# Patient Record
Sex: Female | Born: 1960 | Race: Black or African American | Hispanic: No | Smoking: Never smoker
Health system: Southern US, Community
[De-identification: ages and names within clinical notes are randomized; demographics above are authoritative.]

## PROBLEM LIST (undated history)

## (undated) DIAGNOSIS — IMO0002 Reserved for concepts with insufficient information to code with codable children: Secondary | ICD-10-CM

## (undated) DIAGNOSIS — N83209 Unspecified ovarian cyst, unspecified side: Secondary | ICD-10-CM

## (undated) DIAGNOSIS — J45909 Unspecified asthma, uncomplicated: Secondary | ICD-10-CM

## (undated) DIAGNOSIS — IMO0001 Reserved for inherently not codable concepts without codable children: Secondary | ICD-10-CM

## (undated) DIAGNOSIS — R896 Abnormal cytological findings in specimens from other organs, systems and tissues: Secondary | ICD-10-CM

## (undated) DIAGNOSIS — R102 Pelvic and perineal pain: Secondary | ICD-10-CM

## (undated) DIAGNOSIS — R0789 Other chest pain: Secondary | ICD-10-CM

## (undated) DIAGNOSIS — R2231 Localized swelling, mass and lump, right upper limb: Secondary | ICD-10-CM

## (undated) DIAGNOSIS — Z8742 Personal history of other diseases of the female genital tract: Secondary | ICD-10-CM

## (undated) DIAGNOSIS — S46912A Strain of unspecified muscle, fascia and tendon at shoulder and upper arm level, left arm, initial encounter: Secondary | ICD-10-CM

## (undated) DIAGNOSIS — K219 Gastro-esophageal reflux disease without esophagitis: Secondary | ICD-10-CM

## (undated) DIAGNOSIS — R600 Localized edema: Secondary | ICD-10-CM

## (undated) DIAGNOSIS — L290 Pruritus ani: Secondary | ICD-10-CM

## (undated) DIAGNOSIS — B019 Varicella without complication: Secondary | ICD-10-CM

## (undated) DIAGNOSIS — Z8619 Personal history of other infectious and parasitic diseases: Secondary | ICD-10-CM

## (undated) DIAGNOSIS — Z87898 Personal history of other specified conditions: Secondary | ICD-10-CM

## (undated) DIAGNOSIS — N3941 Urge incontinence: Secondary | ICD-10-CM

## (undated) HISTORY — DX: Reserved for inherently not codable concepts without codable children: IMO0001

## (undated) HISTORY — DX: Unspecified ovarian cyst, unspecified side: N83.209

## (undated) HISTORY — DX: Urge incontinence: N39.41

## (undated) HISTORY — DX: Personal history of other infectious and parasitic diseases: Z86.19

## (undated) HISTORY — PX: ACNE CYST REMOVAL: SUR1112

## (undated) HISTORY — DX: Localized edema: R60.0

## (undated) HISTORY — DX: Unspecified asthma, uncomplicated: J45.909

## (undated) HISTORY — DX: Personal history of other diseases of the female genital tract: Z87.42

## (undated) HISTORY — DX: Reserved for concepts with insufficient information to code with codable children: IMO0002

## (undated) HISTORY — DX: Pruritus ani: L29.0

## (undated) HISTORY — DX: Gastro-esophageal reflux disease without esophagitis: K21.9

## (undated) HISTORY — DX: Pelvic and perineal pain: R10.2

## (undated) HISTORY — DX: Localized swelling, mass and lump, right upper limb: R22.31

## (undated) HISTORY — DX: Other chest pain: R07.89

## (undated) HISTORY — DX: Personal history of other specified conditions: Z87.898

## (undated) HISTORY — DX: Varicella without complication: B01.9

## (undated) HISTORY — DX: Strain of unspecified muscle, fascia and tendon at shoulder and upper arm level, left arm, initial encounter: S46.912A

## (undated) HISTORY — DX: Abnormal cytological findings in specimens from other organs, systems and tissues: R89.6

---

## 1989-06-18 DIAGNOSIS — R87619 Unspecified abnormal cytological findings in specimens from cervix uteri: Secondary | ICD-10-CM

## 1989-06-18 DIAGNOSIS — IMO0002 Reserved for concepts with insufficient information to code with codable children: Secondary | ICD-10-CM

## 1989-06-18 HISTORY — DX: Reserved for concepts with insufficient information to code with codable children: IMO0002

## 1989-06-18 HISTORY — DX: Unspecified abnormal cytological findings in specimens from cervix uteri: R87.619

## 1999-10-19 DIAGNOSIS — N3941 Urge incontinence: Secondary | ICD-10-CM

## 1999-10-19 HISTORY — DX: Urge incontinence: N39.41

## 1999-11-30 ENCOUNTER — Other Ambulatory Visit: Admission: RE | Admit: 1999-11-30 | Discharge: 1999-11-30 | Payer: Self-pay | Admitting: Obstetrics & Gynecology

## 2001-10-18 DIAGNOSIS — Z87898 Personal history of other specified conditions: Secondary | ICD-10-CM

## 2001-10-18 HISTORY — DX: Personal history of other specified conditions: Z87.898

## 2002-01-04 ENCOUNTER — Ambulatory Visit (HOSPITAL_COMMUNITY): Admission: RE | Admit: 2002-01-04 | Discharge: 2002-01-04 | Payer: Self-pay | Admitting: Obstetrics and Gynecology

## 2002-01-04 ENCOUNTER — Encounter: Payer: Self-pay | Admitting: Obstetrics and Gynecology

## 2002-01-15 ENCOUNTER — Other Ambulatory Visit: Admission: RE | Admit: 2002-01-15 | Discharge: 2002-01-15 | Payer: Self-pay | Admitting: Obstetrics and Gynecology

## 2002-01-16 DIAGNOSIS — N83209 Unspecified ovarian cyst, unspecified side: Secondary | ICD-10-CM

## 2002-01-16 HISTORY — DX: Unspecified ovarian cyst, unspecified side: N83.209

## 2002-01-22 ENCOUNTER — Ambulatory Visit (HOSPITAL_COMMUNITY): Admission: RE | Admit: 2002-01-22 | Discharge: 2002-01-22 | Payer: Self-pay | Admitting: Obstetrics and Gynecology

## 2002-01-22 ENCOUNTER — Encounter: Payer: Self-pay | Admitting: Obstetrics and Gynecology

## 2002-02-27 ENCOUNTER — Encounter: Payer: Self-pay | Admitting: Obstetrics and Gynecology

## 2002-02-27 ENCOUNTER — Ambulatory Visit (HOSPITAL_COMMUNITY): Admission: RE | Admit: 2002-02-27 | Discharge: 2002-02-27 | Payer: Self-pay | Admitting: Obstetrics and Gynecology

## 2002-04-03 ENCOUNTER — Encounter: Payer: Self-pay | Admitting: Obstetrics and Gynecology

## 2002-04-03 ENCOUNTER — Ambulatory Visit (HOSPITAL_COMMUNITY): Admission: RE | Admit: 2002-04-03 | Discharge: 2002-04-03 | Payer: Self-pay | Admitting: Obstetrics and Gynecology

## 2002-08-20 ENCOUNTER — Encounter: Admission: RE | Admit: 2002-08-20 | Discharge: 2002-08-20 | Payer: Self-pay | Admitting: Internal Medicine

## 2002-08-20 ENCOUNTER — Encounter: Payer: Self-pay | Admitting: Internal Medicine

## 2003-01-16 ENCOUNTER — Encounter: Payer: Self-pay | Admitting: Obstetrics and Gynecology

## 2003-01-16 ENCOUNTER — Ambulatory Visit (HOSPITAL_COMMUNITY): Admission: RE | Admit: 2003-01-16 | Discharge: 2003-01-16 | Payer: Self-pay | Admitting: Obstetrics and Gynecology

## 2003-02-12 ENCOUNTER — Other Ambulatory Visit: Admission: RE | Admit: 2003-02-12 | Discharge: 2003-02-12 | Payer: Self-pay | Admitting: Obstetrics and Gynecology

## 2004-01-21 ENCOUNTER — Ambulatory Visit (HOSPITAL_COMMUNITY): Admission: RE | Admit: 2004-01-21 | Discharge: 2004-01-21 | Payer: Self-pay | Admitting: Obstetrics and Gynecology

## 2004-02-24 ENCOUNTER — Other Ambulatory Visit: Admission: RE | Admit: 2004-02-24 | Discharge: 2004-02-24 | Payer: Self-pay | Admitting: Obstetrics and Gynecology

## 2004-10-18 DIAGNOSIS — IMO0001 Reserved for inherently not codable concepts without codable children: Secondary | ICD-10-CM

## 2004-10-18 DIAGNOSIS — R87610 Atypical squamous cells of undetermined significance on cytologic smear of cervix (ASC-US): Secondary | ICD-10-CM | POA: Insufficient documentation

## 2004-10-18 HISTORY — DX: Reserved for inherently not codable concepts without codable children: IMO0001

## 2005-01-22 ENCOUNTER — Ambulatory Visit (HOSPITAL_COMMUNITY): Admission: RE | Admit: 2005-01-22 | Discharge: 2005-01-22 | Payer: Self-pay | Admitting: Obstetrics and Gynecology

## 2005-02-26 ENCOUNTER — Other Ambulatory Visit: Admission: RE | Admit: 2005-02-26 | Discharge: 2005-02-26 | Payer: Self-pay | Admitting: Obstetrics and Gynecology

## 2005-10-28 ENCOUNTER — Other Ambulatory Visit: Admission: RE | Admit: 2005-10-28 | Discharge: 2005-10-28 | Payer: Self-pay | Admitting: Obstetrics and Gynecology

## 2005-12-16 DIAGNOSIS — R102 Pelvic and perineal pain: Secondary | ICD-10-CM

## 2005-12-16 HISTORY — DX: Pelvic and perineal pain: R10.2

## 2006-01-24 ENCOUNTER — Ambulatory Visit (HOSPITAL_COMMUNITY): Admission: RE | Admit: 2006-01-24 | Discharge: 2006-01-24 | Payer: Self-pay | Admitting: Obstetrics and Gynecology

## 2006-04-17 DIAGNOSIS — S46912A Strain of unspecified muscle, fascia and tendon at shoulder and upper arm level, left arm, initial encounter: Secondary | ICD-10-CM

## 2006-04-17 HISTORY — DX: Strain of unspecified muscle, fascia and tendon at shoulder and upper arm level, left arm, initial encounter: S46.912A

## 2006-04-18 ENCOUNTER — Other Ambulatory Visit: Admission: RE | Admit: 2006-04-18 | Discharge: 2006-04-18 | Payer: Self-pay | Admitting: Obstetrics and Gynecology

## 2007-01-26 ENCOUNTER — Ambulatory Visit (HOSPITAL_COMMUNITY): Admission: RE | Admit: 2007-01-26 | Discharge: 2007-01-26 | Payer: Self-pay | Admitting: Obstetrics and Gynecology

## 2007-03-08 ENCOUNTER — Encounter: Admission: RE | Admit: 2007-03-08 | Discharge: 2007-03-08 | Payer: Self-pay | Admitting: Family Medicine

## 2008-01-29 ENCOUNTER — Ambulatory Visit (HOSPITAL_COMMUNITY): Admission: RE | Admit: 2008-01-29 | Discharge: 2008-01-29 | Payer: Self-pay | Admitting: Obstetrics and Gynecology

## 2008-05-18 DIAGNOSIS — R2231 Localized swelling, mass and lump, right upper limb: Secondary | ICD-10-CM

## 2008-05-18 HISTORY — DX: Localized swelling, mass and lump, right upper limb: R22.31

## 2008-10-02 DIAGNOSIS — R2230 Localized swelling, mass and lump, unspecified upper limb: Secondary | ICD-10-CM | POA: Insufficient documentation

## 2008-10-09 ENCOUNTER — Encounter: Admission: RE | Admit: 2008-10-09 | Discharge: 2008-10-09 | Payer: Self-pay | Admitting: Obstetrics and Gynecology

## 2009-02-07 ENCOUNTER — Encounter: Admission: RE | Admit: 2009-02-07 | Discharge: 2009-02-07 | Payer: Self-pay | Admitting: Obstetrics and Gynecology

## 2009-05-18 DIAGNOSIS — L290 Pruritus ani: Secondary | ICD-10-CM

## 2009-05-18 HISTORY — DX: Pruritus ani: L29.0

## 2010-03-03 ENCOUNTER — Encounter: Admission: RE | Admit: 2010-03-03 | Discharge: 2010-03-03 | Payer: Self-pay | Admitting: Obstetrics and Gynecology

## 2010-11-08 ENCOUNTER — Encounter: Payer: Self-pay | Admitting: Obstetrics and Gynecology

## 2011-02-01 ENCOUNTER — Other Ambulatory Visit (HOSPITAL_COMMUNITY): Payer: Self-pay | Admitting: Obstetrics and Gynecology

## 2011-02-01 DIAGNOSIS — Z1231 Encounter for screening mammogram for malignant neoplasm of breast: Secondary | ICD-10-CM

## 2011-02-12 ENCOUNTER — Ambulatory Visit (INDEPENDENT_AMBULATORY_CARE_PROVIDER_SITE_OTHER): Payer: Federal, State, Local not specified - PPO | Admitting: Cardiovascular Disease

## 2011-02-12 ENCOUNTER — Encounter: Payer: Self-pay | Admitting: Cardiovascular Disease

## 2011-02-12 VITALS — BP 100/70 | HR 60 | Resp 12 | Ht 65.0 in | Wt 228.0 lb

## 2011-02-12 DIAGNOSIS — R079 Chest pain, unspecified: Secondary | ICD-10-CM | POA: Insufficient documentation

## 2011-02-12 DIAGNOSIS — R072 Precordial pain: Secondary | ICD-10-CM

## 2011-02-12 DIAGNOSIS — J45909 Unspecified asthma, uncomplicated: Secondary | ICD-10-CM | POA: Insufficient documentation

## 2011-02-12 NOTE — Patient Instructions (Signed)
Your physician has requested that you have a stress echocardiogram. For further information please visit [email or web address]. Please follow instruction sheet as given.   

## 2011-02-12 NOTE — Progress Notes (Signed)
50 yo referred by Dr [facility] [address].  Longstanding asthma.  2 weeks of atypical upper chest pain. 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  Asthma flair recently as well but pain not related to asthmatic tightness.  No cough sputum or fever.  No previous cardiac problem  Active at work and home.  Sedentary.  Nonsmoker.  No history of DVT or PE.  Pain is persistant but not progressive.  Upper chest radiates to right shoulder.  Not taking antiinflamatory meds. Denies palpiations, syncope, PND orthopnea or edema.    ROS: Denies fever, malais, weight loss, blurry vision, decreased visual acuity, cough, sputum, SOB, hemoptysis, pleuritic pain, palpitaitons, heartburn, abdominal pain, melena, lower extremity edema, claudication, or rash.   General: Affect appropriate Healthy:  appears stated age HEENT: normal Neck supple with no adenopathy JVP normal no bruits no thyromegaly Lungs clear with no wheezing and good diaphragmatic motion Heart:  S1/S2 no murmur,rub, gallop or click PMI normal Abdomen: benighn, BS positve, no tenderness, no AAA no bruit.  No HSM or HJR Distal pulses intact with no bruits No edema Neuro non-focal Skin warm and dry No muscular weakness  Medications Current Outpatient Prescriptions  Medication Sig Dispense Refill  . FOLIC ACID PO 1 tab po qd         Allergies Review of patient's allergies indicates no known allergies.  Family History: Family History  Problem Relation Age of Onset  . Diabetes    . Emphysema Sister   . Stroke Maternal Grandmother     Social History: History   Social History  . Marital Status: Single    Spouse Name: N/A    Number of Children: N/A  . Years of Education: N/A   Occupational History  . Not on file.   Social History Main Topics  . Smoking status: Never Smoker   . Smokeless tobacco: Never Used  . Alcohol Use: No  . Drug Use: No  . Sexually Active: Not on file    Other Topics Concern  . Not on file   Social History Narrative  . No narrative on file    Electrocardiogram:  NSR normal ECG rate 60  Assessment and Plan

## 2011-02-12 NOTE — Assessment & Plan Note (Signed)
Atypical likely muscular.  Normal exam and ECG  F/U stress echo

## 2011-02-12 NOTE — Assessment & Plan Note (Signed)
Continue albuterol PRN.  Prednisone taper per primary if wheezing persists

## 2011-02-16 DIAGNOSIS — Z8742 Personal history of other diseases of the female genital tract: Secondary | ICD-10-CM

## 2011-02-16 HISTORY — DX: Personal history of other diseases of the female genital tract: Z87.42

## 2011-02-19 ENCOUNTER — Ambulatory Visit (HOSPITAL_COMMUNITY): Payer: Federal, State, Local not specified - PPO | Attending: Cardiovascular Disease | Admitting: Radiology

## 2011-02-19 DIAGNOSIS — R072 Precordial pain: Secondary | ICD-10-CM

## 2011-02-19 DIAGNOSIS — R0789 Other chest pain: Secondary | ICD-10-CM | POA: Insufficient documentation

## 2011-03-08 ENCOUNTER — Telehealth: Payer: Self-pay | Admitting: *Deleted

## 2011-03-08 NOTE — Telephone Encounter (Signed)
PT AWARE NORMAL STRESS ECHO./CY

## 2011-03-11 ENCOUNTER — Ambulatory Visit (HOSPITAL_COMMUNITY)
Admission: RE | Admit: 2011-03-11 | Discharge: 2011-03-11 | Disposition: A | Discharge status: Home / Self Care | Payer: Federal, State, Local not specified - PPO | Source: Ambulatory Visit | Attending: Obstetrics and Gynecology | Admitting: Obstetrics and Gynecology

## 2011-03-11 DIAGNOSIS — Z1231 Encounter for screening mammogram for malignant neoplasm of breast: Secondary | ICD-10-CM

## 2011-05-16 ENCOUNTER — Emergency Department (HOSPITAL_COMMUNITY): Payer: Federal, State, Local not specified - PPO

## 2011-05-16 ENCOUNTER — Emergency Department (HOSPITAL_COMMUNITY)
Admission: EM | Admit: 2011-05-16 | Discharge: 2011-05-17 | Disposition: A | Discharge status: Home / Self Care | Payer: Federal, State, Local not specified - PPO | Attending: Emergency Medicine | Admitting: Emergency Medicine

## 2011-05-16 DIAGNOSIS — R079 Chest pain, unspecified: Secondary | ICD-10-CM | POA: Insufficient documentation

## 2011-05-16 DIAGNOSIS — M940 Chondrocostal junction syndrome [Tietze]: Secondary | ICD-10-CM | POA: Insufficient documentation

## 2011-05-16 DIAGNOSIS — R55 Syncope and collapse: Secondary | ICD-10-CM | POA: Insufficient documentation

## 2011-05-16 DIAGNOSIS — M7989 Other specified soft tissue disorders: Secondary | ICD-10-CM | POA: Insufficient documentation

## 2011-05-16 DIAGNOSIS — R062 Wheezing: Secondary | ICD-10-CM | POA: Insufficient documentation

## 2011-05-16 LAB — CBC
HCT: 38 % (ref 36.0–46.0)
Hemoglobin: 12.8 g/dL (ref 12.0–15.0)
MCV: 85.6 fL (ref 78.0–100.0)
Platelets: 210 10*3/uL (ref 150–400)
RDW: 13.6 % (ref 11.5–15.5)
WBC: 4.5 10*3/uL (ref 4.0–10.5)

## 2011-05-16 LAB — TROPONIN I: Troponin I: <0.3 ng/mL (ref <0.30)

## 2011-05-16 LAB — DIFFERENTIAL
Eosinophils Relative: 11 % — ABNORMAL HIGH (ref 0–5)
Monocytes Relative: 8 % (ref 3–12)
Neutrophils Relative %: 23 % — ABNORMAL LOW (ref 43–77)

## 2011-05-16 LAB — CK TOTAL AND CKMB (NOT AT ARMC)
CK, MB: 3 ng/mL (ref 0.3–4.0)
Total CK: 135 U/L (ref 7–177)

## 2011-05-16 LAB — BASIC METABOLIC PANEL
Calcium: 9.1 mg/dL (ref 8.4–10.5)
Creatinine, Ser: 0.92 mg/dL (ref 0.50–1.10)
GFR calc Af Amer: >60 mL/min (ref >60)

## 2011-10-01 ENCOUNTER — Ambulatory Visit (INDEPENDENT_AMBULATORY_CARE_PROVIDER_SITE_OTHER): Payer: Federal, State, Local not specified - PPO | Admitting: Cardiovascular Disease

## 2011-10-01 ENCOUNTER — Encounter: Payer: Self-pay | Admitting: Cardiovascular Disease

## 2011-10-01 DIAGNOSIS — J45909 Unspecified asthma, uncomplicated: Secondary | ICD-10-CM

## 2011-10-01 DIAGNOSIS — R079 Chest pain, unspecified: Secondary | ICD-10-CM

## 2011-10-01 NOTE — Progress Notes (Signed)
50 yo referred by [facility] [address]. Longstanding asthma. 2 weeks of atypical upper chest pain. 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 Asthma flair recently as well but pain not related to asthmatic tightness. No cough sputum or fever. No previous cardiac problem Active at work and home. Sedentary. Nonsmoker. No history of DVT or PE. Pain is persistant but not progressive. Upper chest radiates to right shoulder. Not taking antiinflamatory meds.  Denies palpiations, syncope, PND orthopnea or edema.   Seen in urgent care yesterday for atypical pain.  ECG reviewed and normal.  Pain has muscular overtones and is helped by NSAI  Also some relief with belching. Reviewed her stress echo from 5/12 which was normal.    ROS: Denies fever, malais, weight loss, blurry vision, decreased visual acuity, cough, sputum, SOB, hemoptysis, pleuritic pain, palpitaitons, heartburn, abdominal pain, melena, lower extremity edema, claudication, or rash.  All other systems reviewed and negative  General: Affect appropriate Healthy:  appears stated age HEENT: normal Neck supple with no adenopathy JVP normal no bruits no thyromegaly Lungs clear with midl inspitory wheezing and good diaphragmatic motion Heart:  S1/S2 no murmur,rub, gallop or click PMI normal Abdomen: benighn, BS positve, no tenderness, no AAA no bruit.  No HSM or HJR Distal pulses intact with no bruits No edema Neuro non-focal Skin warm and dry No muscular weakness   Current Outpatient Prescriptions  Medication Sig Dispense Refill  . albuterol (PROVENTIL) (2.5 MG/3ML) 0.083% nebulizer solution Take 2.5 mg by nebulization every 6 (six) hours as needed.        . Vitamin D, Ergocalciferol, (DRISDOL) 50000 UNITS CAPS Take 1 tablet by mouth Once a week.        Allergies  Review of patient's allergies indicates no known allergies.  Electrocardiogram:  Assessment and Plan

## 2011-10-01 NOTE — Assessment & Plan Note (Signed)
Atypical pain Normal stress echo  And normal ECG.  No need for further w/u

## 2011-10-01 NOTE — Assessment & Plan Note (Signed)
Got flu shot yesterday.  Mild wheezing Continue inhaler

## 2011-12-31 ENCOUNTER — Encounter: Payer: Self-pay | Admitting: Family Medicine

## 2011-12-31 ENCOUNTER — Ambulatory Visit (INDEPENDENT_AMBULATORY_CARE_PROVIDER_SITE_OTHER): Payer: Federal, State, Local not specified - PPO | Admitting: Family Medicine

## 2011-12-31 DIAGNOSIS — M791 Myalgia, unspecified site: Secondary | ICD-10-CM

## 2011-12-31 DIAGNOSIS — E559 Vitamin D deficiency, unspecified: Secondary | ICD-10-CM

## 2011-12-31 DIAGNOSIS — IMO0001 Reserved for inherently not codable concepts without codable children: Secondary | ICD-10-CM

## 2011-12-31 DIAGNOSIS — E669 Obesity, unspecified: Secondary | ICD-10-CM

## 2011-12-31 DIAGNOSIS — L299 Pruritus, unspecified: Secondary | ICD-10-CM

## 2011-12-31 MED ORDER — CETIRIZINE HCL 10 MG PO TABS: 10.0000 mg | ORAL_TABLET | Freq: Every day | ORAL | Status: DC

## 2011-12-31 NOTE — Progress Notes (Signed)
  Subjective:    Patient ID: Taylor Reynolds, female    DOB: May 14, 1961, 51 y.o.   MRN: [id number]  HPI This 51 y.o AA has a history of Vitamin D deficiency diagnosed last May at [facility]. She took  Drisdol for 3 months but then switched to Citracal. She has no hx of Diabetes, Lipids disorder, Thyroid  disorder or Anemia. She needs Vit D level rechecked.        Also has a itchy rash on right hand with small bumps for 24 hours. She has no food allergies and has  dog which is located outside. 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 She has knee pain and left shoulder pain with burning in the left anterior chest.     Review of Systems As per HPI; otherwise, noncontributory     Objective:   Physical Exam  Vitals reviewed. Constitutional: She is oriented to person, place, and time. She appears well-developed and well-nourished. No distress.       Moderately obese  HENT:  Head: Normocephalic and atraumatic.  Right Ear: External ear normal.  Left Ear: External ear normal.  Nose: Nose normal.  Mouth/Throat: Oropharynx is clear and moist.  Eyes: Conjunctivae and EOM are normal. No scleral icterus.  Neck: Normal range of motion. Neck supple. No thyromegaly present.  Cardiovascular: Normal rate, regular rhythm and normal heart sounds.  Exam reveals no gallop and no friction rub.   No murmur heard. Pulmonary/Chest: Effort normal and breath sounds normal. No respiratory distress.       Left anterior chest: tenderness with moderate palpation of pectoralis major muscle  Abdominal: Soft. Bowel sounds are normal. She exhibits no distension and no mass. There is no tenderness. There is no guarding.  Musculoskeletal: Normal range of motion. She exhibits no edema.  Lymphadenopathy:    She has no cervical adenopathy.  Neurological: She is alert and oriented to person, place, and time. She has normal reflexes. No cranial nerve deficit.    Skin: Skin is warm and dry.       Right hand: dry scaly area on dorsum of hand between thumb and 1st digit  Psychiatric: She has a normal mood and affect. Her behavior is normal. Judgment and thought content normal.          Assessment & Plan:   1. Vitamin d deficiency  Vitamin D, 25-hydroxy level p ending RX: Drisdol 50,000 IU  1 capsule once a week pending   results  2. Itching  Rash on hand probably work-related as pt is right-hand dominate; wear gloves at work and use moisturizer for dry skin RX: Cetirizine 10 mg  1 tab daily for itching  (pt has a lot of dust exposure at work)  3. Obesity  Counseled re: weight reduction  4. Myalgia  Comprehensive metabolic panel

## 2011-12-31 NOTE — Patient Instructions (Signed)
Vitamin D Deficiency  Not having enough vitamin D is called a deficiency. Your body needs this vitamin to keep your bones strong and healthy. Having too little of it can make your bones soft or can cause other health problems.  HOME CARE  Take all vitamins, herbs, or nutrition drinks (supplements) as told by your doctor.   Have your blood tested 2 months after taking vitamins, herbs, or nutrition drinks.   Eat foods that have vitamin D. This includes:   Dairy products, cereals, or juices with added vitamin D. Check the label.   Fatty fish like salmon or trout.   Eggs.   Oysters.   Go outside for 10 to 15 minutes when the sun is shining. Do this 3 times a week. Do not do this if you have skin cancer.   Do not use tanning beds.   Stay at a healthy weight. Lose weight if needed.   Keep all doctor visits as told.  GET HELP IF:  You have questions.   You continue to have problems.   You feel sick to your stomach (nauseous) or throw up (vomit).   You cannot go poop (constipated).   You feel confused.   You have severe belly (abdominal) or back pain.  MAKE SURE YOU:  Understand these instructions.   Will watch your condition.   Will get help right away if you are not doing well or get worse.  Document Released: 09/23/2011 Document Reviewed: 09/21/2011 Exit[employer] Patient Information 2012 [employer]. 

## 2012-01-01 LAB — CBC WITH DIFFERENTIAL/PLATELET
Basophils Relative: 1 % (ref 0–1)
Eosinophils Absolute: 0.6 10*3/uL (ref 0.0–0.7)
Hemoglobin: 12.4 g/dL (ref 12.0–15.0)
MCH: 28 pg (ref 26.0–34.0)
MCHC: 32.4 g/dL (ref 30.0–36.0)
Monocytes Absolute: 0.4 10*3/uL (ref 0.1–1.0)
Monocytes Relative: 9 % (ref 3–12)
Neutrophils Relative %: 27 % — ABNORMAL LOW (ref 43–77)

## 2012-01-01 LAB — COMPREHENSIVE METABOLIC PANEL
AST: 16 U/L (ref 0–37)
Alkaline Phosphatase: 49 U/L (ref 39–117)
CO2: 25 mEq/L (ref 19–32)
Glucose, Bld: 109 mg/dL — ABNORMAL HIGH (ref 70–99)
Sodium: 137 mEq/L (ref 135–145)
Total Protein: 6.5 g/dL (ref 6.0–8.3)

## 2012-01-01 LAB — VITAMIN D 25 HYDROXY (VIT D DEFICIENCY, FRACTURES): Vit D, 25-Hydroxy: 23 ng/mL — ABNORMAL LOW (ref 30–89)

## 2012-01-07 DIAGNOSIS — E559 Vitamin D deficiency, unspecified: Secondary | ICD-10-CM | POA: Insufficient documentation

## 2012-01-07 DIAGNOSIS — E669 Obesity, unspecified: Secondary | ICD-10-CM | POA: Insufficient documentation

## 2012-01-07 NOTE — Progress Notes (Signed)
Quick Note:  Please call pt and advise that the following labs are abnormal... Vitamin D level is low. Continue the medication prescribed.  Other labs are within normal limits.  Provide a copy of labs to pt. ______

## 2012-01-18 ENCOUNTER — Other Ambulatory Visit: Payer: Self-pay | Admitting: Obstetrics and Gynecology

## 2012-01-18 DIAGNOSIS — Z1231 Encounter for screening mammogram for malignant neoplasm of breast: Secondary | ICD-10-CM

## 2012-02-15 ENCOUNTER — Ambulatory Visit: Payer: Self-pay | Admitting: Obstetrics and Gynecology

## 2012-03-06 ENCOUNTER — Ambulatory Visit (INDEPENDENT_AMBULATORY_CARE_PROVIDER_SITE_OTHER): Payer: Federal, State, Local not specified - PPO | Admitting: Obstetrics and Gynecology

## 2012-03-06 ENCOUNTER — Encounter: Payer: Self-pay | Admitting: Obstetrics and Gynecology

## 2012-03-06 VITALS — BP 118/68 | Ht 65.5 in | Wt 236.0 lb

## 2012-03-06 DIAGNOSIS — Z124 Encounter for screening for malignant neoplasm of cervix: Secondary | ICD-10-CM

## 2012-03-06 DIAGNOSIS — Z1231 Encounter for screening mammogram for malignant neoplasm of breast: Secondary | ICD-10-CM

## 2012-03-06 NOTE — Progress Notes (Signed)
Last Pap:02/08/2011 WNL: No atypical squamous cells Regular Periods:yes Contraception: None  Monthly Breast exam:yes Tetanus<3yrs:yes Nl.Bladder Function:yes Daily BMs:yes Healthy Diet:yes Calcium:yes Mammogram:yes Exercise:no Seatbelt: yes Abuse at home: no Stressful work:no Sigmoid-colonoscopy: no Bone Density: No Pt without complaints Physical Examination: General appearance - alert, well appearing, and in no distress Mental status - normal mood, behavior, speech, dress, motor activity, and thought processes Neck - supple, no significant adenopathy, thyroid exam: thyroid is normal in size without nodules or tenderness Chest - clear to auscultation, no wheezes, rales or rhonchi, symmetric air entry Heart - normal rate and regular rhythm Abdomen - soft, nontender, nondistended, no masses or organomegaly Breasts - breasts appear normal, no suspicious masses, no skin or nipple changes or axillary nodes Pelvic - normal external genitalia, vulva, vagina, cervix, uterus and adnexa Rectal - normal rectal, no masses Back exam - full range of motion, no tenderness, palpable spasm or pain on motion Neurological - alert, oriented, normal speech, no focal findings or movement disorder noted Musculoskeletal - no joint tenderness, deformity or swelling Extremities - no edema, redness or tenderness in the calves or thighs Skin - normal coloration and turgor, no rashes, no suspicious skin lesions noted Routine exam Pap sent yes Mammogram due yes none RT one year

## 2012-03-06 NOTE — Patient Instructions (Signed)
Colonoscopy A colonoscopy is an exam to evaluate your entire colon. In this exam, your colon is cleansed. A long fiberoptic tube is inserted through your rectum and into your colon. The fiberoptic scope (endoscope) is a long bundle of enclosed and very flexible fibers. These fibers transmit light to the area examined and send images from that area to your caregiver. Discomfort is usually minimal. You may be given a drug to help you sleep (sedative) during or prior to the procedure. This exam helps to detect lumps (tumors), polyps, inflammation, and areas of bleeding. Your caregiver may also take a small piece of tissue (biopsy) that will be examined under a microscope. LET YOUR CAREGIVER KNOW ABOUT:   Allergies to food or medicine.   Medicines taken, including vitamins, herbs, eyedrops, over-the-counter medicines, and creams.   Use of steroids (by mouth or creams).   Previous problems with anesthetics or numbing medicines.   History of bleeding problems or blood clots.   Previous surgery.   Other health problems, including diabetes and kidney problems.   Possibility of pregnancy, if this applies.  BEFORE THE PROCEDURE   A clear liquid diet may be required for 2 days before the exam.   Ask your caregiver about changing or stopping your regular medications.   Liquid injections (enemas) or laxatives may be required.   A large amount of electrolyte solution may be given to you to drink over a short period of time. This solution is used to clean out your colon.   You should be present 60 minutes prior to your procedure or as directed by your caregiver.  AFTER THE PROCEDURE   If you received a sedative or pain relieving medication, you will need to arrange for someone to drive you home.   Occasionally, there is a little blood passed with the first bowel movement. Do not be concerned.  FINDING OUT THE RESULTS OF YOUR TEST Not all test results are available during your visit. If your test  results are not back during the visit, make an appointment with your caregiver to find out the results. Do not assume everything is normal if you have not heard from your caregiver or the medical facility. It is important for you to follow up on all of your test results. HOME CARE INSTRUCTIONS   It is not unusual to pass moderate amounts of gas and experience mild abdominal cramping following the procedure. This is due to air being used to inflate your colon during the exam. Walking or a warm pack on your belly (abdomen) may help.   You may resume all normal meals and activities after sedatives and medicines have worn off.   Only take over-the-counter or prescription medicines for pain, discomfort, or fever as directed by your caregiver. Do not use aspirin or blood thinners if a biopsy was taken. Consult your caregiver for medicine usage if biopsies were taken.  SEEK IMMEDIATE MEDICAL CARE IF:   You have a fever.   You pass large blood clots or fill a toilet with blood following the procedure. This may also occur 10 to 14 days following the procedure. This is more likely if a biopsy was taken.   You develop abdominal pain that keeps getting worse and cannot be relieved with medicine.  Document Released: 10/01/2000 Document Revised: 09/23/2011 Document Reviewed: 05/16/2008 Exit[employer] Patient Information 2012 [employer]. 

## 2012-03-09 LAB — PAP IG W/ RFLX HPV ASCU

## 2012-03-14 ENCOUNTER — Ambulatory Visit (HOSPITAL_COMMUNITY)
Admission: RE | Admit: 2012-03-14 | Discharge: 2012-03-14 | Disposition: A | Discharge status: Home / Self Care | Payer: Federal, State, Local not specified - PPO | Source: Ambulatory Visit | Attending: Obstetrics and Gynecology | Admitting: Obstetrics and Gynecology

## 2012-03-14 DIAGNOSIS — Z1231 Encounter for screening mammogram for malignant neoplasm of breast: Secondary | ICD-10-CM | POA: Insufficient documentation

## 2012-03-27 ENCOUNTER — Encounter: Payer: Self-pay | Admitting: Obstetrics and Gynecology

## 2012-03-30 ENCOUNTER — Telehealth: Payer: Self-pay | Admitting: Obstetrics and Gynecology

## 2012-03-30 NOTE — Telephone Encounter (Signed)
Niccole/ND pt °

## 2012-03-31 NOTE — Telephone Encounter (Signed)
Spoke with pt rgd msg pt received dense breast letter wants to know if she needs MRI advised pt wil consult with ND and call her back pt voice understanding

## 2012-04-05 ENCOUNTER — Encounter: Payer: Self-pay | Admitting: Family Medicine

## 2012-04-05 ENCOUNTER — Ambulatory Visit (INDEPENDENT_AMBULATORY_CARE_PROVIDER_SITE_OTHER): Payer: Federal, State, Local not specified - PPO | Admitting: Family Medicine

## 2012-04-05 VITALS — BP 124/75 | HR 60 | Temp 97.3°F | Resp 16 | Ht 65.0 in | Wt 234.0 lb

## 2012-04-05 DIAGNOSIS — M542 Cervicalgia: Secondary | ICD-10-CM

## 2012-04-05 DIAGNOSIS — E559 Vitamin D deficiency, unspecified: Secondary | ICD-10-CM

## 2012-04-05 DIAGNOSIS — R202 Paresthesia of skin: Secondary | ICD-10-CM

## 2012-04-05 DIAGNOSIS — R209 Unspecified disturbances of skin sensation: Secondary | ICD-10-CM

## 2012-04-05 DIAGNOSIS — R51 Headache: Secondary | ICD-10-CM

## 2012-04-05 LAB — BASIC METABOLIC PANEL
Chloride: 106 mEq/L (ref 96–112)
Potassium: 4.6 mEq/L (ref 3.5–5.3)

## 2012-04-05 NOTE — Progress Notes (Signed)
  Subjective:    Patient ID: Taylor Reynolds, female    DOB: Sep 11, 1961, 51 y.o.   MRN: [id number]  HPI  This 57 AA female c/o 3-week hx of generalized headaches, relieved with rest. No associated  n/v or visual disturbances . She had eye exam several months ago and will be getting corrective lenses  this month. 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 This causes tension and pain in back of neck as well as shoulder pain and paresthesias  in both arms. Because she works 3rd shift, she has poor nutrition habits, skipping meals and not  maintaining adequate hydration.  Not sleeping 6-8 hours at a time (broken sleep).      Review of Systems  Constitutional: Positive for appetite change and fatigue. Negative for fever, chills and unexpected weight change.  HENT: Positive for neck pain, neck stiffness and voice change. Negative for congestion, sore throat, rhinorrhea, sneezing, trouble swallowing and sinus pressure.   Eyes: Positive for visual disturbance. Negative for photophobia and pain.  Respiratory: Negative for chest tightness and shortness of breath.   Cardiovascular: Negative for chest pain and palpitations.  Musculoskeletal: Positive for back pain.  Neurological: Positive for weakness and headaches. Negative for dizziness, syncope and light-headedness.  Psychiatric/Behavioral: Positive for disturbed wake/sleep cycle.       Objective:   Physical Exam  Nursing note and vitals reviewed. Constitutional: She is oriented to person, place, and time. She appears well-developed and well-nourished. No distress.       Muscle spasms in trapezius   HENT:  Right Ear: External ear normal.  Left Ear: External ear normal.  Mouth/Throat: Oropharynx is clear and moist.  Eyes: Conjunctivae and EOM are normal. No scleral icterus.  Neck: Neck supple. No thyromegaly present.  Cardiovascular: Normal rate, regular rhythm and normal heart  sounds.   No murmur heard. Pulmonary/Chest: Effort normal and breath sounds normal. No respiratory distress.  Musculoskeletal: Normal range of motion. She exhibits no edema.  Lymphadenopathy:    She has no cervical adenopathy.  Neurological: She is alert and oriented to person, place, and time. No cranial nerve deficit.  Skin: Skin is warm and dry.  Psychiatric: She has a normal mood and affect. Her behavior is normal.          Assessment & Plan:   1. Headache - suspect tension -type associated with poor health habits Basic metabolic panel (glucose elevated on CMET in March 2013)  2. Vitamin d deficiency - taking 50,000 IU weekly Vitamin D, 25-hydroxy  3. Neck pain associated with work-related strain Advised relaxation exercises for better neck health Aleve 1-2 tabs bid with meals; moist heat  4. Paresthesias  Consider C-spine films if persists

## 2012-04-05 NOTE — Patient Instructions (Addendum)
Headache, General, Unknown Cause The specific cause of your headache may not have been found today. There are many causes and types of headache. A few common ones are:  Tension headache.   Migraine.   Infections (examples: dental and sinus infections).   Bone and/or joint problems in the neck or jaw.   Depression.   Eye problems.  These headaches are not life threatening.  Headaches can sometimes be diagnosed by a patient history and a physical exam. Sometimes, lab and imaging studies (such as x-ray and/or CT scan) are used to rule out more serious problems. In some cases, a spinal tap (lumbar puncture) may be requested. There are many times when your exam and tests may be normal on the first visit even when there is a serious problem causing your headaches. Because of that, it is very important to follow up with your doctor or local clinic for further evaluation. FINDING OUT THE RESULTS OF TESTS  If a radiology test was performed, a radiologist will review your results.   You will be contacted by the emergency department or your physician if any test results require a change in your treatment plan.   Not all test results may be available during your visit. If your test results are not back during the visit, make an appointment with your caregiver to find out the results. Do not assume everything is normal if you have not heard from your caregiver or the medical facility. It is important for you to follow up on all of your test results.  HOME CARE INSTRUCTIONS   Keep follow-up appointments with your caregiver, or any specialist referral.   Only take over-the-counter or prescription medicines for pain, discomfort, or fever as directed by your caregiver.   Biofeedback, massage, or other relaxation techniques may be helpful.   Ice packs or heat applied to the head and neck can be used. Do this three to four times per day, or as needed.   Call your doctor if you have any questions or  concerns.   If you smoke, you should quit.  SEEK MEDICAL CARE IF:   You develop problems with medications prescribed.   You do not respond to or obtain relief from medications.   You have a change from the usual headache.   You develop nausea or vomiting.  SEEK IMMEDIATE MEDICAL CARE IF:   If your headache becomes severe.   You have an unexplained oral temperature above 102 F (38.9 C), or as your caregiver suggests.   You have a stiff neck.   You have loss of vision.   You have muscular weakness.   You have loss of muscular control.   You develop severe symptoms different from your first symptoms.   You start losing your balance or have trouble walking.   You feel faint or pass out.  MAKE SURE YOU:   Understand these instructions.   Will watch your condition.   Will get help right away if you are not doing well or get worse.  Document Released: 10/04/2005 Document Revised: 09/23/2011 Document Reviewed: 05/23/2008 [facility] Patient Information 2012 [place], [place]     .Degenerative Disc Disease Degenerative disc disease is a condition caused by the changes that occur in the cushions of the backbone (spinal discs) as you grow older. Spinal discs are soft and compressible discs located between the bones of the spine (vertebrae). They act like shock absorbers. Degenerative disc disease can affect the wholespine. However, the neck and lower back are  most commonly affected. Many changes can occur in the spinal discs with aging, such as:  The spinal discs may dry and shrink.   Small tears may occur in the tough, outer covering of the disc (annulus).   The disc space may become smaller due to loss of water.   Abnormal growths in the bone (spurs) may occur. This can put pressure on the nerve roots exiting the spinal canal, causing pain.   The spinal canal may become narrowed.  CAUSES  Degenerative disc disease is a condition caused by the changes that occur in the  spinal discs with aging. The exact cause is not known, but there is a genetic basis for many patients. Degenerative changes can occur due to loss of fluid in the disc. This makes the disc thinner and reduces the space between the backbones. Small cracks can develop in the outer layer of the disc. This can lead to the breakdown of the disc. You are more likely to get degenerative disc disease if you are overweight. Smoking cigarettes and doing heavy work such as weightlifting can also increase your risk of this condition. Degenerative changes can start after a sudden injury. Growth of bone spurs can compress the nerve roots and cause pain.  SYMPTOMS  The symptoms vary from person to person. Some people may have no pain, while others have severe pain. The pain may be so severe that it can limit your activities. The location of the pain depends on the part of your backbone that is affected. You will have neck or arm pain if a disc in the neck area is affected. You will have pain in your back, buttocks, or legs if a disc in the lower back is affected. The pain becomes worse while bending, reaching up, or with twisting movements. The pain may start gradually and then get worse as time passes. It may also start after a major or minor injury. You may feel numbness or tingling in the arms or legs.  DIAGNOSIS  Your caregiver will ask you about your symptoms and about activities or habits that may cause the pain. He or she may also ask about any injuries, diseases, ortreatments you have had earlier. Your caregiver will examine you to check for the range of movement that is possible in the affected area, to check for strength in your extremities, and to check for sensation in the areas of the arms and legs supplied by different nerve roots. An X-ray of the spine may be taken. Your caregiver may suggest other imaging tests, such as a computerized magnetic scan (MRI), if needed.  TREATMENT  Treatment includes rest,  modifying your activities, and applying ice and heat. Your caregiver may prescribe medicines to reduce your pain and may ask you to do some exercises to strengthen your back. In some cases, you may need surgery. You and your caregiver will decide on the treatment that is best for you. HOME CARE INSTRUCTIONS   Follow proper lifting and walking techniques as advised by your caregiver.   Maintain good posture.   Exercise regularly as advised.   Perform relaxation exercises.   Change your sitting, standing, and sleeping habits as advised. Change positions frequently.   Lose weight as advised.   Stop smoking if you smoke.   Wear supportive footwear.  SEEK MEDICAL CARE IF:  The pain does not go away within 1 to 4 weeks. SEEK IMMEDIATE MEDICAL CARE IF:   The pain is severe.   You notice weakness  in your arms, hands, or legs.   You begin to lose control of your bladder or bowel.  MAKE SURE YOU:   Understand these instructions.   Will watch your condition.   Will get help right away if you are not doing well or get worse.  Document Released: 08/01/2007 Document Revised: 09/23/2011 Document Reviewed: 08/01/2007 [facility] Patient Information 2012 [place], [place].    HEADACHES:  Try to eat regular meals and have healthy snacks- do not go more than 4 hours without eating. Drink plenty of fluids, especially water.                            You need 6-8 hours of sleep most nights. Take Aleve for headache- 1-2 tabs twice a day with meals. Moist heat across the back of the neck may help.

## 2012-04-06 LAB — VITAMIN D 25 HYDROXY (VIT D DEFICIENCY, FRACTURES): Vit D, 25-Hydroxy: 17 ng/mL — ABNORMAL LOW (ref 30–89)

## 2012-04-06 NOTE — Telephone Encounter (Signed)
If she has no family history of a first degree relative with breast cancer she does not need any further testing.  She should continue monthly breast exams

## 2012-04-08 ENCOUNTER — Other Ambulatory Visit: Payer: Self-pay | Admitting: Family Medicine

## 2012-04-08 MED ORDER — VITAMIN D (ERGOCALCIFEROL) 1.25 MG (50000 UNIT) PO CAPS: ORAL_CAPSULE | ORAL | Status: DC

## 2012-04-08 NOTE — Progress Notes (Signed)
Quick Note:  Please call pt and advise that the following labs are abnormal...  Vitamin D level is still low. Continue Vit D 50,000 IU supplement 1 capsule every week for next several months; Contact office if she needs refills. Needs to get some sun exposure most days for 10-15 minutes. Other labs are normal.   Copy to pt. ______

## 2012-05-04 ENCOUNTER — Telehealth: Payer: Self-pay | Admitting: Obstetrics and Gynecology

## 2012-05-04 NOTE — Telephone Encounter (Signed)
Lm on vm tcb rgd msg 

## 2012-05-04 NOTE — Telephone Encounter (Signed)
TRIAGE/EPIC °

## 2012-05-05 NOTE — Telephone Encounter (Signed)
Lm on vm tcb rgd msg 

## 2012-05-08 NOTE — Telephone Encounter (Signed)
Spoke with pt rgd msg pt states having prolong cycle 2 weeks wants appt for eval pt has appt 05/19/12 at 11:15 with ND pt voice understanding

## 2012-05-19 ENCOUNTER — Encounter: Payer: Self-pay | Admitting: Obstetrics and Gynecology

## 2012-05-19 ENCOUNTER — Ambulatory Visit (INDEPENDENT_AMBULATORY_CARE_PROVIDER_SITE_OTHER): Payer: Federal, State, Local not specified - PPO | Admitting: Obstetrics and Gynecology

## 2012-05-19 VITALS — BP 110/72 | Wt 233.0 lb

## 2012-05-19 DIAGNOSIS — N926 Irregular menstruation, unspecified: Secondary | ICD-10-CM

## 2012-05-19 LAB — CBC WITH DIFFERENTIAL/PLATELET
Basophils Absolute: 0.1 10*3/uL (ref 0.0–0.1)
Eosinophils Absolute: 0.3 10*3/uL (ref 0.0–0.7)
Lymphs Abs: 2.1 10*3/uL (ref 0.7–4.0)
MCH: 28.5 pg (ref 26.0–34.0)
Neutrophils Relative %: 38 % — ABNORMAL LOW (ref 43–77)
Platelets: 302 10*3/uL (ref 150–400)
RBC: 4.46 MIL/uL (ref 3.87–5.11)
RDW: 14.4 % (ref 11.5–15.5)
WBC: 4.8 10*3/uL (ref 4.0–10.5)

## 2012-05-19 NOTE — Progress Notes (Signed)
When did bleeding start: 04/22/12 How  Long: still bleeding How often changing pad/tampon: once per day Bleeding Disorders: no Cramping: no Contraception: no Fibroids: no Hormone Therapy: no New Medications: no Menopausal Symptoms: no Vag. Discharge: no Abdominal Pain: no Increased Stress: no BP 110/72  Wt 233 lb (105.688 kg)  LMP 04/22/2012 Physical Examination: Neck - supple, no significant adenopathy, thyroid exam: thyroid is normal in size without nodules or tenderness Chest - clear to auscultation, no wheezes, rales or rhonchi, symmetric air entry Heart - normal rate and regular rhythm Abdomen - soft, nontender, nondistended, no masses or organomegaly Pelvic - normal external genitalia, vulva, vagina, cervix, uterus and adnexa Irregular Bleeding Pt stopped.  SHG/EMBX WNL 02/2011 US/EMBX @[facility]  TSH/CBC today

## 2012-05-22 NOTE — Telephone Encounter (Signed)
Did you speak with this pt again

## 2012-05-23 NOTE — Telephone Encounter (Signed)
Pt had appt 05/19/12 and has f/u appt 06/16/12 with you

## 2012-06-16 ENCOUNTER — Encounter: Payer: Federal, State, Local not specified - PPO | Admitting: Obstetrics and Gynecology

## 2012-06-16 ENCOUNTER — Ambulatory Visit (INDEPENDENT_AMBULATORY_CARE_PROVIDER_SITE_OTHER): Payer: Federal, State, Local not specified - PPO | Admitting: Obstetrics and Gynecology

## 2012-06-16 ENCOUNTER — Ambulatory Visit (INDEPENDENT_AMBULATORY_CARE_PROVIDER_SITE_OTHER): Payer: Federal, State, Local not specified - PPO

## 2012-06-16 ENCOUNTER — Encounter: Payer: Self-pay | Admitting: Obstetrics and Gynecology

## 2012-06-16 ENCOUNTER — Other Ambulatory Visit: Payer: Self-pay | Admitting: Obstetrics and Gynecology

## 2012-06-16 VITALS — BP 112/80 | Wt 234.0 lb

## 2012-06-16 DIAGNOSIS — D219 Benign neoplasm of connective and other soft tissue, unspecified: Secondary | ICD-10-CM

## 2012-06-16 DIAGNOSIS — N926 Irregular menstruation, unspecified: Secondary | ICD-10-CM

## 2012-06-16 DIAGNOSIS — N83209 Unspecified ovarian cyst, unspecified side: Secondary | ICD-10-CM

## 2012-06-16 DIAGNOSIS — D259 Leiomyoma of uterus, unspecified: Secondary | ICD-10-CM

## 2012-06-16 LAB — POCT URINE PREGNANCY: Preg Test, Ur: NEGATIVE

## 2012-06-16 MED ORDER — CEPHALEXIN 500 MG PO CAPS: 500.0000 mg | ORAL_CAPSULE | Freq: Four times a day (QID) | ORAL | Status: AC

## 2012-06-16 NOTE — Patient Instructions (Signed)
Fibroids You have been diagnosed as having a fibroid. Fibroids are smooth muscle lumps (tumors) which can occur any place in a woman's body. They are usually in the womb (uterus). The most common problem (symptom) of fibroids is bleeding. Over time this may cause low red blood cells (anemia). Other symptoms include feelings of pressure and pain in the pelvis. The diagnosis (learning what is wrong) of fibroids is made by physical exam. Sometimes tests such as an ultrasound are used. This is helpful when fibroids are felt around the ovaries and to look for tumors. TREATMENT   Most fibroids do not need surgical or medical treatment. Sometimes a tissue sample (biopsy) of the lining of the uterus is done to rule out cancer. If there is no cancer and only a small amount of bleeding, the problem can be watched.   Hormonal treatment can improve the problem.   When surgery is needed, it can consist of removing the fibroid. Vaginal birth may not be possible after the removal of fibroids. This depends on where they are and the extent of surgery. When pregnancy occurs with fibroids it is usually normal.   Your caregiver can help decide which treatments are best for you.  HOME CARE INSTRUCTIONS   Do not use aspirin as this may increase bleeding problems.   If your periods (menses) are heavy, record the number of pads or tampons used per month. Bring this information to your caregiver. This can help them determine the best treatment for you.  SEEK IMMEDIATE MEDICAL CARE IF:  You have pelvic pain or cramps not controlled with medications, or experience a sudden increase in pain.   You have an increase of pelvic bleeding between and during menses.   You feel lightheaded or have fainting spells.   You develop worsening belly (abdominal) pain.  Document Released: 10/01/2000 Document Revised: 09/23/2011 Document Reviewed: 05/23/2008 [employer] Patient Information 2012 [employer].    

## 2012-06-16 NOTE — Progress Notes (Signed)
Pt is here for f/u u/s.  Pt given 600 mg ibuprofen per protocol Claudie Leach M UPT negative Pt states she has not had any more bleeding US uterus 10.18cm by 8.16 cm with three fibroids.  The largest is 4cm Pt also with right simple ovarian cyst that is wcm in sixe EMBX done per protocol did sound to 9 cm  Pt desires observation for her fibroids pts right upper extremity is red and hot to touch.  She states she was bitten.  Will give keflex.  Pt to go to ER if it becomes worse F/u with an [place] in 6-8 weeks to f/u the ovarian cyst

## 2012-06-22 ENCOUNTER — Telehealth: Payer: Self-pay

## 2012-06-22 NOTE — Telephone Encounter (Signed)
Message copied by Rolla Plate on Thu Jun 22, 2012  9:59 AM ------      Message from: Jaymes Graff      Created: Thu Jun 22, 2012  9:48 AM       Please call the patient and let her know her endometrial biopsy is normal

## 2012-06-22 NOTE — Telephone Encounter (Signed)
Spoke with pt rgd labs informed endo biopsy wnl pt voice understanding 

## 2012-07-03 ENCOUNTER — Emergency Department (HOSPITAL_COMMUNITY): Payer: Federal, State, Local not specified - PPO

## 2012-07-03 ENCOUNTER — Encounter (HOSPITAL_COMMUNITY): Payer: Self-pay | Admitting: *Deleted

## 2012-07-03 ENCOUNTER — Emergency Department (HOSPITAL_COMMUNITY)
Admission: EM | Admit: 2012-07-03 | Discharge: 2012-07-03 | Disposition: A | Discharge status: Home / Self Care | Payer: Federal, State, Local not specified - PPO | Attending: Emergency Medicine | Admitting: Emergency Medicine

## 2012-07-03 DIAGNOSIS — K219 Gastro-esophageal reflux disease without esophagitis: Secondary | ICD-10-CM | POA: Insufficient documentation

## 2012-07-03 DIAGNOSIS — IMO0001 Reserved for inherently not codable concepts without codable children: Secondary | ICD-10-CM | POA: Insufficient documentation

## 2012-07-03 DIAGNOSIS — M791 Myalgia, unspecified site: Secondary | ICD-10-CM

## 2012-07-03 DIAGNOSIS — G5600 Carpal tunnel syndrome, unspecified upper limb: Secondary | ICD-10-CM | POA: Insufficient documentation

## 2012-07-03 DIAGNOSIS — J45909 Unspecified asthma, uncomplicated: Secondary | ICD-10-CM | POA: Insufficient documentation

## 2012-07-03 LAB — POCT I-STAT, CHEM 8
Chloride: 105 mEq/L (ref 96–112)
Glucose, Bld: 83 mg/dL (ref 70–99)
HCT: 42 % (ref 36.0–46.0)
Potassium: 4.2 mEq/L (ref 3.5–5.1)

## 2012-07-03 LAB — CBC
HCT: 38.6 % (ref 36.0–46.0)
Hemoglobin: 13.1 g/dL (ref 12.0–15.0)
RDW: 13.8 % (ref 11.5–15.5)
WBC: 5.3 10*3/uL (ref 4.0–10.5)

## 2012-07-03 MED ORDER — NAPROXEN 500 MG PO TABS: 500.0000 mg | ORAL_TABLET | Freq: Two times a day (BID) | ORAL | Status: DC

## 2012-07-03 NOTE — ED Notes (Signed)
Ortho tech paged for Rt wrist Velcro splint

## 2012-07-03 NOTE — ED Notes (Signed)
Pt discharged home in good condition. 

## 2012-07-03 NOTE — Progress Notes (Signed)
Orthopedic Tech Progress Note Patient Details:  Taylor Reynolds May 25, 1961 [id number]  Ortho Devices Type of Ortho Device: Velcro wrist splint Ortho Device/Splint Location: (R) UE Ortho Device/Splint Interventions: Application   Jennye Moccasin 07/03/2012, 9:52 PM

## 2012-07-03 NOTE — ED Notes (Signed)
The pt has had rt finger numbness rt forearm and  Rt thigh numbness  All weekend.  No known injury

## 2012-07-03 NOTE — ED Provider Notes (Signed)
History     CSN: [id number]  Arrival date & time 07/03/12  1710   First MD Initiated Contact with Patient 07/03/12 2029      Chief Complaint  Patient presents with  . Numbness    (Consider location/radiation/quality/duration/timing/severity/associated sxs/prior treatment) HPI Comments: Patient has intermittent episodes of numbness and tingling in her right third, fourth, and fifth finger.  That radiates to the elbow.  It's been more pronounced over the past 3, days.  She also has intermittent right anterior thigh.  Soreness, but she also reports, that she's worked to change shifts in a row at the [employer], which is abnormal for her.  She denies any trauma  The history is provided by the patient.    Past Medical History  Diagnosis Date  . Chest pressure   . Reflux   . History of measles, mumps, or rubella   . Varicella   . Abnormal Pap smear 06/1989  . Urge incontinence 2001  . H/O pelvic mass 2003  . Ovarian cyst 01/2002  . ASCUS (atypical squamous cells of undetermined significance) on Pap smear 2006  . Pelvic pain 12/2005  . Strain of shoulder, left 04/2006  . 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 Anal itching 05/2009  . History of irregular menstrual bleeding 02/2011  . Asthma   . Asthma     Past Surgical History  Procedure Date  . Acne cyst removal     Under eye  . Cesarean section     Family History  Problem Relation Age of Onset  . Diabetes    . Emphysema Sister   . Stroke Maternal Grandmother   . Heart disease Maternal Grandmother   . Diabetes Father   . Heart disease Maternal Grandfather   . Asthma Maternal Grandfather     History  Substance Use Topics  . Smoking status: Never Smoker   . Smokeless tobacco: Never Used  . Alcohol Use: No    OB History    Grav Para Term Preterm Abortions TAB SAB Ect Mult Living   4 3        3       Review of Systems  Constitutional: Negative for fever and chills.  HENT: Negative for neck pain, neck  stiffness and sinus pressure.   Respiratory: Negative for shortness of breath.   Cardiovascular: Negative for leg swelling.  Gastrointestinal: Negative for nausea.  Musculoskeletal: Negative for back pain, joint swelling and arthralgias.  Skin: Negative for rash and wound.  Neurological: Positive for weakness and numbness. Negative for dizziness, facial asymmetry and headaches.    Allergies  Review of patient's allergies indicates no known allergies.  Home Medications   Current Outpatient Rx  Name Route Sig Dispense Refill  . ALBUTEROL SULFATE HFA 108 (90 BASE) MCG/ACT IN AERS Inhalation Inhale 2 puffs into the lungs every 6 (six) hours as needed. For shortness of breath    . CITRACAL + D PO Oral Take 1 tablet by mouth daily.    Marland Kitchen VITAMIN D (ERGOCALCIFEROL) 50000 UNITS PO CAPS Oral Take 50,000 Units by mouth every 7 (seven) days. Mondays    . NAPROXEN 500 MG PO TABS Oral Take 1 tablet (500 mg total) by mouth 2 (two) times daily. 30 tablet 0    BP 138/70  Pulse 86  Temp 98.2 F (36.8 C) (Oral)  Resp 18  SpO2 99%  LMP 05/02/2012  Physical Exam  Constitutional: She is oriented to person, place, and time. She appears well-developed.  She appears distressed.  Neck: Normal range of motion.  Cardiovascular: Normal rate.   Pulmonary/Chest: Effort normal.  Musculoskeletal: Normal range of motion. She exhibits no edema and no tenderness.       Right wrist: She exhibits normal range of motion, no tenderness, no bony tenderness, no swelling, no effusion, no crepitus, no deformity and no laceration.       Positive Tinel's sign on the right, consistent with carpal tunnel syndrome  Neurological: She is alert and oriented to person, place, and time.  Skin: Skin is warm.    ED Course  Procedures (including critical care time)  Labs Reviewed  POCT I-STAT, CHEM 8 - Abnormal; Notable for the following:    Calcium, Ion 1.24 (*)     All other components within normal limits  CBC   Ct Head  Wo Contrast  07/03/2012  *RADIOLOGY REPORT*  Clinical Data: Right side numbness.  Tingling in the right hand and fingers.  CT HEAD WITHOUT CONTRAST  Technique:  Contiguous axial images were obtained from the base of the skull through the vertex without contrast.  Comparison: None.  Findings: There is no evidence of acute intracranial abnormality including infarction, hemorrhage, mass lesion, mass effect, midline shift or abnormal extra-axial fluid collection.  There is some mild appearing chronic microvascular ischemic change.  Calvarium intact. No pneumocephalus or hydrocephalus.  IMPRESSION: No acute finding.   Original Report Authenticated By: Bernadene Bell. D'ALESSIO, M.D.      1. Carpal tunnel syndrome   2. Myalgia       MDM   Upper extremity numbness and tingling, consistent with carpal tunnel, I feel that the leg.  Soreness is most likely overuse from work, but due to patient's concern, head CT to rule out stroke, as this is been going on for greater than one week        Arman Filter, NP 07/03/12 2257

## 2012-07-04 NOTE — ED Provider Notes (Signed)
Medical screening examination/treatment/procedure(s) were performed by non-physician practitioner and as supervising physician I was immediately available for consultation/collaboration.   Celene Kras, MD 07/04/12 [phone]

## 2012-08-09 ENCOUNTER — Encounter: Payer: Federal, State, Local not specified - PPO | Admitting: Obstetrics and Gynecology

## 2012-08-09 ENCOUNTER — Other Ambulatory Visit: Payer: Federal, State, Local not specified - PPO

## 2012-08-11 ENCOUNTER — Encounter: Payer: Federal, State, Local not specified - PPO | Admitting: Obstetrics and Gynecology

## 2012-08-11 ENCOUNTER — Other Ambulatory Visit: Payer: Federal, State, Local not specified - PPO

## 2012-08-15 ENCOUNTER — Encounter: Payer: Self-pay | Admitting: Obstetrics and Gynecology

## 2012-08-15 ENCOUNTER — Ambulatory Visit (INDEPENDENT_AMBULATORY_CARE_PROVIDER_SITE_OTHER): Payer: Federal, State, Local not specified - PPO

## 2012-08-15 ENCOUNTER — Other Ambulatory Visit: Payer: Self-pay | Admitting: Obstetrics and Gynecology

## 2012-08-15 ENCOUNTER — Ambulatory Visit (INDEPENDENT_AMBULATORY_CARE_PROVIDER_SITE_OTHER): Payer: Federal, State, Local not specified - PPO | Admitting: Obstetrics and Gynecology

## 2012-08-15 VITALS — BP 110/80 | Wt 232.0 lb

## 2012-08-15 DIAGNOSIS — N83209 Unspecified ovarian cyst, unspecified side: Secondary | ICD-10-CM

## 2012-08-15 DIAGNOSIS — D259 Leiomyoma of uterus, unspecified: Secondary | ICD-10-CM

## 2012-08-15 DIAGNOSIS — D219 Benign neoplasm of connective and other soft tissue, unspecified: Secondary | ICD-10-CM

## 2012-08-15 NOTE — Progress Notes (Signed)
F/u u/s .   [place] 7.29 cm by 10.4 cm wit multiple fibroids Normal ovaries B Ovarian cyst has resolved RT for AEX

## 2013-02-06 ENCOUNTER — Other Ambulatory Visit: Payer: Self-pay

## 2013-02-06 DIAGNOSIS — Z1231 Encounter for screening mammogram for malignant neoplasm of breast: Secondary | ICD-10-CM

## 2013-02-20 ENCOUNTER — Other Ambulatory Visit: Payer: Self-pay | Admitting: Obstetrics and Gynecology

## 2013-02-20 DIAGNOSIS — N644 Mastodynia: Secondary | ICD-10-CM

## 2013-02-21 ENCOUNTER — Ambulatory Visit (INDEPENDENT_AMBULATORY_CARE_PROVIDER_SITE_OTHER): Payer: Federal, State, Local not specified - PPO

## 2013-02-22 ENCOUNTER — Emergency Department (HOSPITAL_COMMUNITY)
Admission: EM | Admit: 2013-02-22 | Discharge: 2013-02-22 | Disposition: A | Discharge status: Home / Self Care | Payer: Federal, State, Local not specified - PPO | Attending: Emergency Medicine | Admitting: Emergency Medicine

## 2013-02-22 ENCOUNTER — Encounter (HOSPITAL_COMMUNITY): Payer: Self-pay | Admitting: *Deleted

## 2013-02-22 ENCOUNTER — Emergency Department (HOSPITAL_COMMUNITY): Payer: Federal, State, Local not specified - PPO

## 2013-02-22 DIAGNOSIS — Z87448 Personal history of other diseases of urinary system: Secondary | ICD-10-CM | POA: Insufficient documentation

## 2013-02-22 DIAGNOSIS — J45909 Unspecified asthma, uncomplicated: Secondary | ICD-10-CM | POA: Insufficient documentation

## 2013-02-22 DIAGNOSIS — R071 Chest pain on breathing: Secondary | ICD-10-CM | POA: Insufficient documentation

## 2013-02-22 DIAGNOSIS — R079 Chest pain, unspecified: Secondary | ICD-10-CM

## 2013-02-22 DIAGNOSIS — Z79899 Other long term (current) drug therapy: Secondary | ICD-10-CM | POA: Insufficient documentation

## 2013-02-22 DIAGNOSIS — Z8742 Personal history of other diseases of the female genital tract: Secondary | ICD-10-CM | POA: Insufficient documentation

## 2013-02-22 DIAGNOSIS — Z8619 Personal history of other infectious and parasitic diseases: Secondary | ICD-10-CM | POA: Insufficient documentation

## 2013-02-22 DIAGNOSIS — Z8719 Personal history of other diseases of the digestive system: Secondary | ICD-10-CM | POA: Insufficient documentation

## 2013-02-22 DIAGNOSIS — Z87828 Personal history of other (healed) physical injury and trauma: Secondary | ICD-10-CM | POA: Insufficient documentation

## 2013-02-22 DIAGNOSIS — Z872 Personal history of diseases of the skin and subcutaneous tissue: Secondary | ICD-10-CM | POA: Insufficient documentation

## 2013-02-22 LAB — BASIC METABOLIC PANEL
CO2: 25 mEq/L (ref 19–32)
Chloride: 104 mEq/L (ref 96–112)
Creatinine, Ser: 0.96 mg/dL (ref 0.50–1.10)

## 2013-02-22 LAB — POCT I-STAT TROPONIN I: Troponin i, poc: 0 ng/mL (ref 0.00–0.08)

## 2013-02-22 LAB — CBC
HCT: 35.7 % — ABNORMAL LOW (ref 36.0–46.0)
MCV: 81 fL (ref 78.0–100.0)
RBC: 4.41 MIL/uL (ref 3.87–5.11)
WBC: 5.1 10*3/uL (ref 4.0–10.5)

## 2013-02-22 MED ORDER — PANTOPRAZOLE SODIUM 20 MG PO TBEC: 20.0000 mg | DELAYED_RELEASE_TABLET | Freq: Every day | ORAL | Status: DC

## 2013-02-22 NOTE — ED Provider Notes (Signed)
History     CSN: [id number]  Arrival date & time 02/22/13  0603   First MD Initiated Contact with Patient 02/22/13 0622      Chief Complaint  Patient presents with  . Chest Pain    (Consider location/radiation/quality/duration/timing/severity/associated sxs/prior treatment) HPI Comments: Patient with a past medical history significant for GERD, asthma, presents to the ED for intermittent, sharp right-sided chest pain x4 days.  Notes sometimes the pain will radiate to her sternum, but not usually.  Patient states that after she eats, pain seems to get worse and it makes her feel "not right" but always resolves within 1 hour. Patient denies any associated shortness of breath, palpitations, numbness, or weakness of extremities.  Patient has a history of reflux, however she states this is slightly different than her usual sx.  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  Denies any abdominal pain, nausea, vomiting, or diarrhea.  No hx of MI, TIA, or stroke.  Patient has seen cardiology in the past, Dr. Eden Emms- negative stress echo 02/19/11 and cleared from cardiology.  The history is provided by the patient.    Past Medical History  Diagnosis Date  . Chest pressure   . Reflux   . History of measles, mumps, or rubella   . Varicella   . Abnormal Pap smear 06/1989  . Urge incontinence 2001  . H/O pelvic mass 2003  . Ovarian cyst 01/2002  . ASCUS (atypical squamous cells of undetermined significance) on Pap smear 2006  . Pelvic pain 12/2005  . Strain of shoulder, left 04/2006  . 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 Anal itching 05/2009  . History of irregular menstrual bleeding 02/2011  . Asthma   . Asthma     Past Surgical History  Procedure Laterality Date  . Acne cyst removal      Under eye  . Cesarean section      Family History  Problem Relation Age of Onset  . Diabetes    . Emphysema  Sister   . Stroke Maternal Grandmother   . Heart disease Maternal Grandmother   . Diabetes Father   . Heart disease Maternal Grandfather   . Asthma Maternal Grandfather     History  Substance Use Topics  . Smoking status: Never Smoker   . Smokeless tobacco: Never Used  . Alcohol Use: No    OB History   Grav Para Term Preterm Abortions TAB SAB Ect Mult Living   4 3        3       Review of Systems  Cardiovascular: Positive for chest pain.  All other systems reviewed and are negative.    Allergies  Review of patient's allergies indicates no known allergies.  Home Medications   Current Outpatient Rx  Name  Route  Sig  Dispense  Refill  . albuterol (PROVENTIL HFA;VENTOLIN HFA) 108 (90 BASE) MCG/ACT inhaler   Inhalation   Inhale 2 puffs into the lungs every 6 (six) hours as needed. For shortness of breath         . Calcium Citrate-Vitamin D (CITRACAL + D PO)   Oral   Take 1 tablet by mouth daily.         . naproxen (NAPROSYN) 500 MG tablet   Oral   Take 1 tablet (500 mg total) by mouth 2 (two) times daily.   30 tablet   0   .  Vitamin D, Ergocalciferol, (DRISDOL) 50000 UNITS CAPS   Oral   Take 50,000 Units by mouth every 7 (seven) days. Mondays           BP 123/65  Pulse 66  Temp(Src) 97.8 F (36.6 C) (Oral)  Resp 19  SpO2 100%  LMP 02/10/2013  Physical Exam  Nursing note and vitals reviewed. Constitutional: She is oriented to person, place, and time. She appears well-developed and well-nourished.  HENT:  Head: Normocephalic and atraumatic.  Eyes: Conjunctivae and EOM are normal.  Neck: Normal range of motion. Neck supple.  Cardiovascular: Normal rate, regular rhythm and normal heart sounds.   Pulmonary/Chest: Effort normal and breath sounds normal. No respiratory distress. She has no wheezes.  Pain is not reproducible with palpation to chest wall or PROM of right shoulder  Abdominal: Soft. Bowel sounds are normal. There is no tenderness. There  is no guarding and no CVA tenderness.  Musculoskeletal: Normal range of motion.  Neurological: She is alert and oriented to person, place, and time. She has normal strength. No cranial nerve deficit or sensory deficit.  CN grossly intact, no acute neuro deficits appreciated  Skin: Skin is warm and dry.  Psychiatric: She has a normal mood and affect.    ED Course  Procedures (including critical care time)   Date: 02/22/2013  Rate: 63  Rhythm: normal sinus rhythm  QRS Axis: normal  Intervals: normal  ST/T Wave abnormalities: nonspecific ST changes  Conduction Disutrbances:none  Narrative Interpretation: non-specific ST changes, no STEMI  Old EKG Reviewed: unchanged    Labs Reviewed  BASIC METABOLIC PANEL - Abnormal; Notable for the following:    GFR calc non Af Amer 67 (*)    GFR calc Af Amer 78 (*)    All other components within normal limits  CBC - Abnormal; Notable for the following:    HCT 35.7 (*)    All other components within normal limits  POCT I-STAT TROPONIN I  POCT I-STAT TROPONIN I   Dg Chest 2 View  02/22/2013  *RADIOLOGY REPORT*  Clinical Data: 52 year old female with chest pain.  CHEST - 2 VIEW  Comparison: 05/16/2011.  Findings: Improved lung volumes. Normal cardiac size and mediastinal contours.  Visualized tracheal air column is within normal limits.  The lungs are clear.  No pneumothorax or effusion. No acute osseous abnormality identified.  IMPRESSION: Negative, no acute cardiopulmonary abnormality.   Original Report Authenticated By: Erskine Speed, M.D.      1. Chest pain       MDM     Patient presented to the ED for right-sided chest pain. Pain seems to worsen after eating, but it resolves within 1 hour. Pain is not associated with palpitations, shortness of breath, diaphoresis and does not radiate into the extremities.  Pt previously evaluated by cardiology and cleared from their service.  EKG normal sinus rhythm without ischemic changes. Troponin  x2 negative. Chest x-ray without acute cardiopulmonary disease.  Pt remains asx in the ED, NAD, VS stable.  Low suspicion that chest pain is cardiac in nature.  No signs/sx of PE or ACS.  Pt will be started on PPI temporarily to see if there is any improvement of sx.  Rx protonix.  FU with PCP, Dr. Montez Morita to discuss this visit.  Discussed plan with pt- she agreed.  Return precautions advised.  Discussed pt with Dr. Ranae Palms who agrees with plan.     Garlon Hatchet, PA-C 02/22/13 2218  Garlon Hatchet, PA-C  02/22/13 2219 

## 2013-02-22 NOTE — ED Notes (Signed)
Pt c/o sharp pain in the right chest since Tuesday that increases after eating.  Denies n/v, no dizziness, SOB.

## 2013-02-22 NOTE — ED Notes (Signed)
PA at bedside.

## 2013-02-23 NOTE — ED Provider Notes (Signed)
Medical screening examination/treatment/procedure(s) were performed by non-physician practitioner and as supervising physician I was immediately available for consultation/collaboration.   Loren Racer, MD 02/23/13 [phone]

## 2013-03-02 ENCOUNTER — Ambulatory Visit
Admission: RE | Admit: 2013-03-02 | Discharge: 2013-03-02 | Disposition: A | Discharge status: Home / Self Care | Payer: Federal, State, Local not specified - PPO | Source: Ambulatory Visit | Attending: Obstetrics and Gynecology | Admitting: Obstetrics and Gynecology

## 2013-03-02 DIAGNOSIS — N644 Mastodynia: Secondary | ICD-10-CM

## 2013-03-08 ENCOUNTER — Emergency Department (HOSPITAL_BASED_OUTPATIENT_CLINIC_OR_DEPARTMENT_OTHER)
Admission: EM | Admit: 2013-03-08 | Discharge: 2013-03-08 | Disposition: A | Discharge status: Home / Self Care | Payer: Federal, State, Local not specified - PPO | Attending: Emergency Medicine | Admitting: Emergency Medicine

## 2013-03-08 ENCOUNTER — Encounter (HOSPITAL_BASED_OUTPATIENT_CLINIC_OR_DEPARTMENT_OTHER): Payer: Self-pay | Admitting: *Deleted

## 2013-03-08 ENCOUNTER — Emergency Department (HOSPITAL_BASED_OUTPATIENT_CLINIC_OR_DEPARTMENT_OTHER): Payer: Federal, State, Local not specified - PPO

## 2013-03-08 DIAGNOSIS — Z8619 Personal history of other infectious and parasitic diseases: Secondary | ICD-10-CM | POA: Insufficient documentation

## 2013-03-08 DIAGNOSIS — K219 Gastro-esophageal reflux disease without esophagitis: Secondary | ICD-10-CM | POA: Insufficient documentation

## 2013-03-08 DIAGNOSIS — Z87828 Personal history of other (healed) physical injury and trauma: Secondary | ICD-10-CM | POA: Insufficient documentation

## 2013-03-08 DIAGNOSIS — Z8679 Personal history of other diseases of the circulatory system: Secondary | ICD-10-CM | POA: Insufficient documentation

## 2013-03-08 DIAGNOSIS — Z79899 Other long term (current) drug therapy: Secondary | ICD-10-CM | POA: Insufficient documentation

## 2013-03-08 DIAGNOSIS — Z87448 Personal history of other diseases of urinary system: Secondary | ICD-10-CM | POA: Insufficient documentation

## 2013-03-08 DIAGNOSIS — Z87898 Personal history of other specified conditions: Secondary | ICD-10-CM | POA: Insufficient documentation

## 2013-03-08 DIAGNOSIS — R0789 Other chest pain: Secondary | ICD-10-CM

## 2013-03-08 DIAGNOSIS — Z872 Personal history of diseases of the skin and subcutaneous tissue: Secondary | ICD-10-CM | POA: Insufficient documentation

## 2013-03-08 DIAGNOSIS — R071 Chest pain on breathing: Secondary | ICD-10-CM | POA: Insufficient documentation

## 2013-03-08 LAB — TROPONIN I: Troponin I: <0.3 ng/mL (ref <0.30)

## 2013-03-08 NOTE — ED Provider Notes (Addendum)
History     CSN: [id number]  Arrival date & time 03/08/13  0029   First MD Initiated Contact with Patient 03/08/13 0134      Chief Complaint  Patient presents with  . Chest Pain    (Consider location/radiation/quality/duration/timing/severity/associated sxs/prior treatment) HPI This is a 52 year old female who developed the sudden onset of a sharp pain below her left breast yesterday evening about 4 hours prior to arrival. She also characterizes it as a "catch". The onset was while she was eating. The pain was moderate to severe and worse with movement of her left arm, chest or with lifting. The pain has subsequently almost completely resolved. It was not worse with deep breathing. There was no shortness of breath, nausea or diaphoresis.  Past Medical History  Diagnosis Date  . Chest pressure   . Reflux   . History of measles, mumps, or rubella   . Varicella   . Abnormal Pap smear 06/1989  . Urge incontinence 2001  . H/O pelvic mass 2003  . Ovarian cyst 01/2002  . ASCUS (atypical squamous cells of undetermined significance) on Pap smear 2006  . Pelvic pain 12/2005  . Strain of shoulder, left 04/2006  . 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 Anal itching 05/2009  . History of irregular menstrual bleeding 02/2011    Past Surgical History  Procedure Laterality Date  . Acne cyst removal      Under eye  . Cesarean section      Family History  Problem Relation Age of Onset  . Diabetes    . Emphysema Sister   . Stroke Maternal Grandmother   . Heart disease Maternal Grandmother   . Diabetes Father   . Heart disease Maternal Grandfather   . Asthma Maternal Grandfather     History  Substance Use Topics  . Smoking status: Never Smoker   . Smokeless tobacco: Never Used  . Alcohol Use: No    OB History   Grav Para Term Preterm Abortions TAB SAB Ect Mult Living   4 3        3       Review of Systems  All other systems reviewed and are negative.    Allergies  Review of  patient's allergies indicates no known allergies.  Home Medications   Current Outpatient Rx  Name  Route  Sig  Dispense  Refill  . albuterol (PROVENTIL HFA;VENTOLIN HFA) 108 (90 BASE) MCG/ACT inhaler   Inhalation   Inhale 2 puffs into the lungs every 6 (six) hours as needed. For shortness of breath         . pantoprazole (PROTONIX) 20 MG tablet   Oral   Take 1 tablet (20 mg total) by mouth daily.   30 tablet   0     BP 126/65  Pulse 70  Temp(Src) 99 F (37.2 C) (Oral)  Resp 16  Ht 5\' 5"  (1.651 m)  Wt 224 lb (101.606 kg)  BMI 37.28 kg/m2  SpO2 99%  LMP 02/10/2013  Physical Exam General: Well-developed, well-nourished female in no acute distress; appearance consistent with age of record HENT: normocephalic, atraumatic Eyes: pupils equal round and reactive to light; extraocular muscles intact; arcus senilis bilaterally Neck: supple Heart: regular rate and rhythm; no murmurs, rubs or gallops Lungs: clear to auscultation bilaterally Chest: No chest wall tenderness; no rash seen Abdomen: soft; nondistended; nontender Extremities: No deformity; full range of motion Neurologic: Awake, alert and oriented; motor function intact in all extremities and symmetric; no facial  droop Skin: Warm and dry Psychiatric: Normal mood and affect    ED Course  Procedures (including critical care time)    MDM  Nursing notes and vitals signs, including pulse oximetry, reviewed.  Summary of this visit's results, reviewed by myself:  Labs:  Results for orders placed during the hospital encounter of 03/08/13 (from the past 24 hour(s))  TROPONIN I     Status: None   Collection Time    03/08/13  1:47 AM      Result Value Range   Troponin I <0.30  <0.30 ng/mL    Imaging Studies: Dg Chest 2 View  03/08/2013   *RADIOLOGY REPORT*  Clinical Data: Chest pain.  CHEST - 2 VIEW  Comparison: 02/22/2013.  Findings: The cardiac silhouette, mediastinal and hilar contours are normal and stable.   The lungs are clear.  No pleural effusion. The bony thorax is intact.  IMPRESSION: No acute cardiopulmonary findings.   Original Report Authenticated By: Rudie Meyer, M.D.       Date: 03/08/2013 12:48 AM  Rate: 69  Rhythm: normal sinus rhythm  QRS Axis: normal  Intervals: normal  ST/T Wave abnormalities: normal  Conduction Disutrbances: none  Narrative Interpretation: unremarkable  Comparison with previous EKG: unchanged          Hanley Seamen, MD 03/08/13 0217  Hanley Seamen, MD 03/08/13 [phone]

## 2013-03-08 NOTE — ED Notes (Signed)
Pt c/o left side chest pain under left breast x 4 hrs denies SOB N/V

## 2013-03-15 ENCOUNTER — Ambulatory Visit: Payer: Federal, State, Local not specified - PPO

## 2013-10-26 ENCOUNTER — Encounter (HOSPITAL_COMMUNITY): Payer: Self-pay | Admitting: Emergency Medicine

## 2013-10-26 ENCOUNTER — Emergency Department (HOSPITAL_COMMUNITY)
Admission: EM | Admit: 2013-10-26 | Discharge: 2013-10-26 | Disposition: A | Discharge status: Home / Self Care | Payer: Federal, State, Local not specified - PPO | Attending: Emergency Medicine | Admitting: Emergency Medicine

## 2013-10-26 DIAGNOSIS — R109 Unspecified abdominal pain: Secondary | ICD-10-CM

## 2013-10-26 DIAGNOSIS — Z8742 Personal history of other diseases of the female genital tract: Secondary | ICD-10-CM | POA: Insufficient documentation

## 2013-10-26 DIAGNOSIS — Z872 Personal history of diseases of the skin and subcutaneous tissue: Secondary | ICD-10-CM | POA: Insufficient documentation

## 2013-10-26 DIAGNOSIS — Z87448 Personal history of other diseases of urinary system: Secondary | ICD-10-CM | POA: Insufficient documentation

## 2013-10-26 DIAGNOSIS — Z87828 Personal history of other (healed) physical injury and trauma: Secondary | ICD-10-CM | POA: Insufficient documentation

## 2013-10-26 DIAGNOSIS — K649 Unspecified hemorrhoids: Secondary | ICD-10-CM | POA: Insufficient documentation

## 2013-10-26 DIAGNOSIS — Z8639 Personal history of other endocrine, nutritional and metabolic disease: Secondary | ICD-10-CM | POA: Insufficient documentation

## 2013-10-26 DIAGNOSIS — K219 Gastro-esophageal reflux disease without esophagitis: Secondary | ICD-10-CM | POA: Insufficient documentation

## 2013-10-26 DIAGNOSIS — Z79899 Other long term (current) drug therapy: Secondary | ICD-10-CM | POA: Insufficient documentation

## 2013-10-26 DIAGNOSIS — Z862 Personal history of diseases of the blood and blood-forming organs and certain disorders involving the immune mechanism: Secondary | ICD-10-CM | POA: Insufficient documentation

## 2013-10-26 DIAGNOSIS — Z8619 Personal history of other infectious and parasitic diseases: Secondary | ICD-10-CM | POA: Insufficient documentation

## 2013-10-26 LAB — CBC WITH DIFFERENTIAL/PLATELET
Basophils Absolute: 0 10*3/uL (ref 0.0–0.1)
Basophils Relative: 0 % (ref 0–1)
EOS ABS: 0.2 10*3/uL (ref 0.0–0.7)
EOS PCT: 3 % (ref 0–5)
HCT: 38 % (ref 36.0–46.0)
HEMOGLOBIN: 12.8 g/dL (ref 12.0–15.0)
LYMPHS ABS: 2.2 10*3/uL (ref 0.7–4.0)
Lymphocytes Relative: 36 % (ref 12–46)
MCH: 28.8 pg (ref 26.0–34.0)
MCHC: 33.7 g/dL (ref 30.0–36.0)
MCV: 85.6 fL (ref 78.0–100.0)
MONO ABS: 0.5 10*3/uL (ref 0.1–1.0)
MONOS PCT: 8 % (ref 3–12)
Neutro Abs: 3.1 10*3/uL (ref 1.7–7.7)
Neutrophils Relative %: 52 % (ref 43–77)
Platelets: 230 10*3/uL (ref 150–400)
RBC: 4.44 MIL/uL (ref 3.87–5.11)
RDW: 13.7 % (ref 11.5–15.5)
WBC: 5.9 10*3/uL (ref 4.0–10.5)

## 2013-10-26 LAB — COMPREHENSIVE METABOLIC PANEL
ALK PHOS: 55 U/L (ref 39–117)
ALT: 15 U/L (ref 0–35)
AST: 16 U/L (ref 0–37)
Albumin: 3.3 g/dL — ABNORMAL LOW (ref 3.5–5.2)
BUN: 16 mg/dL (ref 6–23)
CALCIUM: 9.1 mg/dL (ref 8.4–10.5)
CO2: 23 mEq/L (ref 19–32)
CREATININE: 0.9 mg/dL (ref 0.50–1.10)
Chloride: 104 mEq/L (ref 96–112)
GFR calc non Af Amer: 73 mL/min — ABNORMAL LOW (ref >90)
GFR, EST AFRICAN AMERICAN: 84 mL/min — AB (ref >90)
GLUCOSE: 97 mg/dL (ref 70–99)
Potassium: 4 mEq/L (ref 3.7–5.3)
Sodium: 137 mEq/L (ref 137–147)
TOTAL PROTEIN: 7.3 g/dL (ref 6.0–8.3)
Total Bilirubin: 0.2 mg/dL — ABNORMAL LOW (ref 0.3–1.2)

## 2013-10-26 LAB — URINALYSIS, ROUTINE W REFLEX MICROSCOPIC
Bilirubin Urine: NEGATIVE
Glucose, UA: NEGATIVE mg/dL
Hgb urine dipstick: NEGATIVE
KETONES UR: NEGATIVE mg/dL
LEUKOCYTES UA: NEGATIVE
NITRITE: NEGATIVE
PH: 5.5 (ref 5.0–8.0)
Protein, ur: NEGATIVE mg/dL
SPECIFIC GRAVITY, URINE: 1.01 (ref 1.005–1.030)
Urobilinogen, UA: 0.2 mg/dL (ref 0.0–1.0)

## 2013-10-26 LAB — WET PREP, GENITAL
Clue Cells Wet Prep HPF POC: NONE SEEN
Trich, Wet Prep: NONE SEEN
WBC, Wet Prep HPF POC: NONE SEEN
YEAST WET PREP: NONE SEEN

## 2013-10-26 LAB — OCCULT BLOOD, POC DEVICE: FECAL OCCULT BLD: NEGATIVE

## 2013-10-26 NOTE — ED Notes (Signed)
Error in Charting: [name] PA collected Wet Prep, Genital, not [employer] NT.

## 2013-10-26 NOTE — ED Notes (Addendum)
Heather, PA at bedside. 

## 2013-10-26 NOTE — Discharge Instructions (Signed)
Abdominal Pain, Women °Abdominal (stomach, pelvic, or belly) pain can be caused by many things. It is important to tell your doctor: °· The location of the pain. °· Does it come and go or is it present all the time? °· Are there things that start the pain (eating certain foods, exercise)? °· Are there other symptoms associated with the pain (fever, nausea, vomiting, diarrhea)? °All of this is helpful to know when trying to find the cause of the pain. °CAUSES  °· Stomach: virus or bacteria infection, or ulcer. °· Intestine: appendicitis (inflamed appendix), regional ileitis (Crohn's disease), ulcerative colitis (inflamed colon), irritable bowel syndrome, diverticulitis (inflamed diverticulum of the colon), or cancer of the stomach or intestine. °· Gallbladder disease or stones in the gallbladder. °· Kidney disease, kidney stones, or infection. °· Pancreas infection or cancer. °· Fibromyalgia (pain disorder). °· Diseases of the female organs: °· Uterus: fibroid (non-cancerous) tumors or infection. °· Fallopian tubes: infection or tubal pregnancy. °· Ovary: cysts or tumors. °· Pelvic adhesions (scar tissue). °· Endometriosis (uterus lining tissue growing in the pelvis and on the pelvic organs). °· Pelvic congestion syndrome (female organs filling up with blood just before the menstrual period). °· Pain with the menstrual period. °· Pain with ovulation (producing an egg). °· Pain with an IUD (intrauterine device, birth control) in the uterus. °· Cancer of the female organs. °· Functional pain (pain not caused by a disease, may improve without treatment). °· Psychological pain. °· Depression. °DIAGNOSIS  °Your doctor will decide the seriousness of your pain by doing an examination. °· Blood tests. °· X-rays. °· Ultrasound. °· CT scan (computed tomography, special type of X-ray). °· MRI (magnetic resonance imaging). °· Cultures, for infection. °· Barium enema (dye inserted in the large intestine, to better view it with  X-rays). °· Colonoscopy (looking in intestine with a lighted tube). °· Laparoscopy (minor surgery, looking in abdomen with a lighted tube). °· Major abdominal exploratory surgery (looking in abdomen with a large incision). °TREATMENT  °The treatment will depend on the cause of the pain.  °· Many cases can be observed and treated at home. °· Over-the-counter medicines recommended by your caregiver. °· Prescription medicine. °· Antibiotics, for infection. °· Birth control pills, for painful periods or for ovulation pain. °· Hormone treatment, for endometriosis. °· Nerve blocking injections. °· Physical therapy. °· Antidepressants. °· Counseling with a psychologist or psychiatrist. °· Minor or major surgery. °HOME CARE INSTRUCTIONS  °· Do not take laxatives, unless directed by your caregiver. °· Take over-the-counter pain medicine only if ordered by your caregiver. Do not take aspirin because it can cause an upset stomach or bleeding. °· Try a clear liquid diet (broth or water) as ordered by your caregiver. Slowly move to a bland diet, as tolerated, if the pain is related to the stomach or intestine. °· Have a thermometer and take your temperature several times a day, and record it. °· Bed rest and sleep, if it helps the pain. °· Avoid sexual intercourse, if it causes pain. °· Avoid stressful situations. °· Keep your follow-up appointments and tests, as your caregiver orders. °· If the pain does not go away with medicine or surgery, you may try: °· Acupuncture. °· Relaxation exercises (yoga, meditation). °· Group therapy. °· Counseling. °SEEK MEDICAL CARE IF:  °· You notice certain foods cause stomach pain. °· Your home care treatment is not helping your pain. °· You need stronger pain medicine. °· You want your IUD removed. °· You feel faint or   lightheaded. °· You develop nausea and vomiting. °· You develop a rash. °· You are having side effects or an allergy to your medicine. °SEEK IMMEDIATE MEDICAL CARE IF:  °· Your  pain does not go away or gets worse. °· You have a fever. °· Your pain is felt only in portions of the abdomen. The right side could possibly be appendicitis. The left lower portion of the abdomen could be colitis or diverticulitis. °· You are passing blood in your stools (bright red or black tarry stools, with or without vomiting). °· You have blood in your urine. °· You develop chills, with or without a fever. °· You pass out. °MAKE SURE YOU:  °· Understand these instructions. °· Will watch your condition. °· Will get help right away if you are not doing well or get worse. °Document Released: 08/01/2007 Document Revised: 12/27/2011 Document Reviewed: 08/21/2009 °[employer]® Patient Information ©2014 [employer]. ° °

## 2013-10-26 NOTE — ED Provider Notes (Signed)
CSN: [id number]     Arrival date & time 10/26/13  0919 History   First MD Initiated Contact with Patient 10/26/13 1126     Chief Complaint  Patient presents with  . Abdominal Pain  . Hemorrhoids   (Consider location/radiation/quality/duration/timing/severity/associated sxs/prior Treatment) HPI Comments: Patient presents with a chief complaint of lower abdominal pain.  She reports that the pain came on after eating eggs this morning.  She describes the pain as pressure and reports that it has improved at this time.  She has not taken anything for the pain.  She reports that she felt that the pressure radiated to her rectal area.  She denies nausea, vomiting, or diarrhea.  No fever or chills.  Last BM was this morning, which she reports was normal.  She denies any urinary symptoms.  Patient denies vaginal bleeding or vaginal discharge.  Patient is a 53 y.o. female presenting with abdominal pain. The history is provided by the patient.  Abdominal Pain   Past Medical History  Diagnosis Date  . Chest pressure   . Reflux   . History of measles, mumps, or rubella   . Varicella   . Abnormal Pap smear 06/1989  . Urge incontinence 2001  . H/O pelvic mass 2003  . Ovarian cyst 01/2002  . ASCUS (atypical squamous cells of undetermined significance) on Pap smear 2006  . Pelvic pain 12/2005  . Strain of shoulder, left 04/2006  . 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 Anal itching 05/2009  . History of irregular menstrual bleeding 02/2011   Past Surgical History  Procedure Laterality Date  . Acne cyst removal      Under eye  . Cesarean section     Family History  Problem Relation Age of Onset  . Diabetes    . Emphysema Sister   . Stroke Maternal Grandmother   . Heart disease Maternal Grandmother   . Diabetes Father   . Heart disease Maternal Grandfather   . Asthma Maternal Grandfather    History  Substance Use Topics  . Smoking status: Never Smoker   . Smokeless tobacco: Never Used  . Alcohol  Use: No   OB History   Grav Para Term Preterm Abortions TAB SAB Ect Mult Living   4 3        3      Review of Systems  Gastrointestinal: Positive for abdominal pain.  All other systems reviewed and are negative.    Allergies  Bee venom and Peanuts  Home Medications   Current Outpatient Rx  Name  Route  Sig  Dispense  Refill  . albuterol (PROVENTIL HFA;VENTOLIN HFA) 108 (90 BASE) MCG/ACT inhaler   Inhalation   Inhale 2 puffs into the lungs every 6 (six) hours as needed. For shortness of breath         . EPINEPHrine (EPIPEN 2-PAK) 0.3 mg/0.3 mL SOAJ injection      0.3 mg.         . montelukast (SINGULAIR) 10 MG tablet   Oral   Take 10 mg by mouth daily as needed (allergies).          . pantoprazole (PROTONIX) 20 MG tablet   Oral   Take 1 tablet (20 mg total) by mouth daily.   30 tablet   0   . Vitamin D, Ergocalciferol, (DRISDOL) 50000 UNITS CAPS capsule   Oral   Take 50,000 Units by mouth every 30 (thirty) days.         . vitamin  E 100 UNIT capsule   Oral   Take 100 Units by mouth daily.          BP 119/59  Pulse 73  Temp(Src) 98.7 F (37.1 C)  Resp 16  Ht 5' 5.5" (1.664 m)  Wt 225 lb (102.059 kg)  BMI 36.86 kg/m2  SpO2 97%  LMP 10/01/2013 Physical Exam  Nursing note and vitals reviewed. Constitutional: She appears well-developed and well-nourished. No distress.  HENT:  Head: Normocephalic and atraumatic.  Mouth/Throat: Oropharynx is clear and moist.  Neck: Normal range of motion. Neck supple.  Cardiovascular: Normal rate, regular rhythm and normal heart sounds.   Pulmonary/Chest: Effort normal and breath sounds normal.  Abdominal: Soft. Bowel sounds are normal. She exhibits no distension and no mass. There is no rigidity, no rebound and no guarding.  Mild suprapubic tenderness to palpation  Genitourinary: Rectum normal. Rectal exam shows no external hemorrhoid, no mass and no tenderness. Guaiac negative stool. Cervix exhibits no motion  tenderness. Right adnexum displays no mass, no tenderness and no fullness. Left adnexum displays no mass, no tenderness and no fullness.  Musculoskeletal: Normal range of motion.  Neurological: She is alert.  Skin: Skin is warm and dry. She is not diaphoretic.  Psychiatric: She has a normal mood and affect.    ED Course  Procedures (including critical care time) Labs Review Labs Reviewed - No data to display Imaging Review No results found.  EKG Interpretation   None       MDM  No diagnosis found. Patient presenting with a chief complaint of lower abdominal pain, which she describes as a "pressure."  Pain had improved without intervention by the time of my evaluation.  On exam, patient with mild suprapubic tenderness to palpation.  No rebound or guarding.  No adnexal pain or CMT on pelvic exam.  Patient is afebrile.  Labs unremarkable.  UA negative.  Doubt surgical abdomen.  Do not feel that any imaging is indicated at this time.  Feel that the patient is stable for discharge.  Patient instructed to follow up with PCP and with Gynecologist.  Return precautions given.    [name], PA-C 10/26/13 [phone]

## 2013-10-26 NOTE — ED Notes (Signed)
Pt states she had 2 hard boiled eggs this morning.  Since that time she's having lower abdominal pain.  Pt states she is nauseous, but hasn't vomited.  Pt also states she is having rectal pain and it might be hemorrhoids.

## 2013-10-26 NOTE — ED Notes (Signed)
PT comfortable with d/c and f/u instructions. No prescriptions. 

## 2013-10-27 LAB — GC/CHLAMYDIA PROBE AMP
CT Probe RNA: NEGATIVE
GC Probe RNA: NEGATIVE

## 2013-10-30 NOTE — ED Provider Notes (Signed)
Medical screening examination/treatment/procedure(s) were performed by non-physician practitioner and as supervising physician I was immediately available for consultation/collaboration.  Kiyani Jernigan J. Zaylee Cornia, MD 10/30/13 1128 

## 2014-02-06 ENCOUNTER — Other Ambulatory Visit: Payer: Self-pay | Admitting: Obstetrics and Gynecology

## 2014-02-06 DIAGNOSIS — Z1231 Encounter for screening mammogram for malignant neoplasm of breast: Secondary | ICD-10-CM

## 2014-03-05 ENCOUNTER — Ambulatory Visit (HOSPITAL_COMMUNITY): Payer: Federal, State, Local not specified - PPO | Attending: Obstetrics and Gynecology

## 2014-03-27 ENCOUNTER — Ambulatory Visit (HOSPITAL_COMMUNITY)
Admission: RE | Admit: 2014-03-27 | Discharge: 2014-03-27 | Disposition: A | Discharge status: Home / Self Care | Payer: Federal, State, Local not specified - PPO | Source: Ambulatory Visit | Attending: Obstetrics and Gynecology | Admitting: Obstetrics and Gynecology

## 2014-03-27 DIAGNOSIS — Z1231 Encounter for screening mammogram for malignant neoplasm of breast: Secondary | ICD-10-CM | POA: Insufficient documentation

## 2014-03-29 ENCOUNTER — Other Ambulatory Visit: Payer: Self-pay | Admitting: Obstetrics and Gynecology

## 2014-03-29 DIAGNOSIS — R928 Other abnormal and inconclusive findings on diagnostic imaging of breast: Secondary | ICD-10-CM

## 2014-04-10 ENCOUNTER — Ambulatory Visit
Admission: RE | Admit: 2014-04-10 | Discharge: 2014-04-10 | Disposition: A | Discharge status: Home / Self Care | Payer: Federal, State, Local not specified - PPO | Source: Ambulatory Visit | Attending: Obstetrics and Gynecology | Admitting: Obstetrics and Gynecology

## 2014-04-10 DIAGNOSIS — R928 Other abnormal and inconclusive findings on diagnostic imaging of breast: Secondary | ICD-10-CM

## 2014-08-19 ENCOUNTER — Encounter (HOSPITAL_COMMUNITY): Payer: Self-pay | Admitting: Emergency Medicine

## 2015-03-14 ENCOUNTER — Other Ambulatory Visit (HOSPITAL_COMMUNITY): Payer: Self-pay | Admitting: Obstetrics and Gynecology

## 2015-03-14 DIAGNOSIS — Z1231 Encounter for screening mammogram for malignant neoplasm of breast: Secondary | ICD-10-CM

## 2015-03-30 ENCOUNTER — Ambulatory Visit (INDEPENDENT_AMBULATORY_CARE_PROVIDER_SITE_OTHER): Payer: Federal, State, Local not specified - PPO | Admitting: Family Medicine

## 2015-03-30 VITALS — BP 116/70 | HR 66 | Temp 98.3°F | Resp 18 | Ht 64.5 in | Wt 236.0 lb

## 2015-03-30 DIAGNOSIS — R609 Edema, unspecified: Secondary | ICD-10-CM

## 2015-03-30 DIAGNOSIS — E669 Obesity, unspecified: Secondary | ICD-10-CM | POA: Diagnosis not present

## 2015-03-30 MED ORDER — FUROSEMIDE 20 MG PO TABS: 20.0000 mg | ORAL_TABLET | Freq: Every day | ORAL | Status: DC | PRN

## 2015-03-30 NOTE — Progress Notes (Signed)
Subjective:  This chart was scribed for Delman Cheadle, MD by Moises Blood, Medical Scribe. This patient was seen in Room 2 and the patient's care was started 3:35 PM.    Patient ID: Taylor Reynolds, female    DOB: 1961/03/15, 54 y.o.   MRN: [id number] Chief Complaint  Patient presents with  . Edema    both ankles x1 mth off and on    HPI Taylor Reynolds is a 54 y.o. female who presents to [facility] complaining of gradual onset ankles swelling that started about a month ago.  She has to stand on her feet a lot as she works at the [employer] a night. She used some lotrimin cream for some feet rash. She has tried elevating her feet but without much relief. She went to [place] last weekend and the 10 hour drive seem to have made it worse. She made few stops in between the drive. She also notes often sleeping on a chair and it makes her ankles swell. She denies calf pain. She also denies SOB laying down supine and o/n.   She is currently not on any medications and has not tried ibuprofen. She has tried some tylenol. She informed that she only drinks about a bottle of water a night. She mentions drinking a lot of ginger ale and also eating out at fast food restaurants very often.   She starts seeing Dr. Lottie Dawson for PCP at [facility] in [place] on July 5th.  On July 22nd, she plans to see her gynecologist, Dr. Charlesetta Garibaldi, for her pap smear.  She also mentioned having her mammogram planned to be done in July as well.  She has gained 10 lbs in the past 5 months.   She has a history of asthma and only takes medication if needed.    Past Medical History  Diagnosis Date  . Chest pressure   . Reflux   . History of measles, mumps, or rubella   . Varicella   . Abnormal Pap smear 06/1989  . Urge incontinence 2001  . H/O pelvic mass 2003  . Ovarian cyst 01/2002  . ASCUS (atypical squamous cells of undetermined significance) on Pap smear 2006  . Pelvic pain 12/2005  . Strain of shoulder, left  04/2006  . 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 Anal itching 05/2009  . History of irregular menstrual bleeding 02/2011  . Asthma    Current Outpatient Prescriptions on File Prior to Visit  Medication Sig Dispense Refill  . albuterol (PROVENTIL HFA;VENTOLIN HFA) 108 (90 BASE) MCG/ACT inhaler Inhale 2 puffs into the lungs every 6 (six) hours as needed. For shortness of breath    . EPINEPHrine (EPIPEN 2-PAK) 0.3 mg/0.3 mL SOAJ injection 0.3 mg.    . montelukast (SINGULAIR) 10 MG tablet Take 10 mg by mouth daily as needed (allergies).     . pantoprazole (PROTONIX) 20 MG tablet Take 1 tablet (20 mg total) by mouth daily. (Patient not taking: Reported on 03/30/2015) 30 tablet 0  . Vitamin D, Ergocalciferol, (DRISDOL) 50000 UNITS CAPS capsule Take 50,000 Units by mouth every 30 (thirty) days.    . vitamin E 100 UNIT capsule Take 100 Units by mouth daily.     No current facility-administered medications on file prior to visit.   Allergies  Allergen Reactions  . Bee Venom Anaphylaxis  . 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 Negative for fever, chills,  diaphoresis, appetite change and unexpected weight change.  Eyes: Negative for visual disturbance.  Respiratory: Negative for cough and shortness of breath.   Cardiovascular: Positive for leg swelling. Negative for chest pain and palpitations.  Endocrine: Negative for polydipsia.  Genitourinary: Negative for decreased urine volume.  Musculoskeletal: Positive for back pain, joint swelling (ankles) and arthralgias. Negative for myalgias and gait problem.  Skin: Positive for rash.  Neurological: Negative for syncope and headaches.  Hematological: Does not bruise/bleed easily.  Psychiatric/Behavioral: Positive for sleep disturbance.       Objective:   Physical Exam  Constitutional: She is oriented to person, place, and time. She appears well-developed and well-nourished. No  distress.  HENT:  Head: Normocephalic and atraumatic.  Eyes: EOM are normal. Pupils are equal, round, and reactive to light.  Neck: Neck supple.  Cardiovascular: Normal rate.   Pulmonary/Chest: Effort normal. No respiratory distress. She has wheezes.  Musculoskeletal: Normal range of motion.       Right ankle: She exhibits swelling (1+ edema with pitting).       Left ankle: She exhibits swelling (2+ edema with minimal pitting).  Neurological: She is alert and oriented to person, place, and time.  Skin: Skin is warm and dry.  Psychiatric: She has a normal mood and affect. Her behavior is normal.  Nursing note and vitals reviewed.   BP 116/70 mmHg  Pulse 66  Temp(Src) 98.3 F (36.8 C) (Oral)  Resp 18  Ht 5' 4.5" (1.638 m)  Wt 236 lb (107.049 kg)  BMI 39.90 kg/m2  SpO2 95%  LMP 02/16/2015       Assessment & Plan:   1. Peripheral edema   2. Obesity   Likely due to obesity and standing on feet all day. Rec trial of medium grade compression socks. Increase water, exercise, increase protein in diet and dec salt - esp fast-food.  Elevated when possible.   Pt would like to try prn diuretic and will increase K in diet. RTC prn or if additional sxs develop  Meds ordered this encounter  Medications  . furosemide (LASIX) 20 MG tablet    Sig: Take 1 tablet (20 mg total) by mouth daily as needed for edema.    Dispense:  30 tablet    Refill:  0    I personally performed the services described in this documentation, which was scribed in my presence. The recorded information has been reviewed and considered, and addended by me as needed.  Delman Cheadle, MD MPH

## 2015-03-30 NOTE — Patient Instructions (Signed)
Whenever you take a lasix to get rid of some of your lower extremity swelling, make sure you have several sources of potassium along with it and make sure you drink extra water so that you don't get to dehydrated. Check out medium grade compression socks - you can find them on amazon - the sockwell brand works great and feels great.  Wearing these (or compression hose) while at work will help your feet feel better at the end of the day. Make sure you are sleeping with your legs above your heart.  Make sure you are getting exercise several times per week for your legs and lungs.  Increase protein and water in your diet, decrease salt and soda  Potassium Content of Foods Potassium is a mineral found in many foods and drinks. It helps keep fluids and minerals balanced in your body and affects how steadily your heart beats. Potassium also helps control your blood pressure and keep your muscles and nervous system healthy. Certain health conditions and medicines may change the balance of potassium in your body. When this happens, you can help balance your level of potassium through the foods that you do or do not eat. Your health care provider or dietitian may recommend an amount of potassium that you should have each day. The following lists of foods provide the amount of potassium (in parentheses) per serving in each item. HIGH IN POTASSIUM  The following foods and beverages have 200 mg or more of potassium per serving:  Apricots, 2 raw or 5 dry (200 mg).  Artichoke, 1 medium (345 mg).  Avocado, raw,  each (245 mg).  Banana, 1 medium (425 mg).  Beans, lima, or baked beans, canned,  cup (280 mg).  Beans, white, canned,  cup (595 mg).  Beef roast, 3 oz (320 mg).  Beef, ground, 3 oz (270 mg).  Beets, raw or cooked,  cup (260 mg).  Bran muffin, 2 oz (300 mg).  Broccoli,  cup (230 mg).  Brussels sprouts,  cup (250 mg).  Cantaloupe,  cup (215 mg).  Cereal, 100% bran,  cup (200-400  mg).  Cheeseburger, single, fast food, 1 each (225-400 mg).  Chicken, 3 oz (220 mg).  Clams, canned, 3 oz (535 mg).  Crab, 3 oz (225 mg).  Dates, 5 each (270 mg).  Dried beans and peas,  cup (300-475 mg).  Figs, dried, 2 each (260 mg).  Fish: halibut, tuna, cod, snapper, 3 oz (480 mg).  Fish: salmon, haddock, swordfish, perch, 3 oz (300 mg).  Fish, tuna, canned 3 oz (200 mg).  [place] fries, fast food, 3 oz (470 mg).  Granola with fruit and nuts,  cup (200 mg).  Grapefruit juice,  cup (200 mg).  Greens, beet,  cup (655 mg).  Honeydew melon,  cup (200 mg).  Kale, raw, 1 cup (300 mg).  Kiwi, 1 medium (240 mg).  Kohlrabi, rutabaga, parsnips,  cup (280 mg).  Lentils,  cup (365 mg).  Mango, 1 each (325 mg).  Milk, chocolate, 1 cup (420 mg).  Milk: nonfat, low-fat, whole, buttermilk, 1 cup (350-380 mg).  Molasses, 1 Tbsp (295 mg).  Mushrooms,  cup (280) mg.  Nectarine, 1 each (275 mg).  Nuts: almonds, peanuts, hazelnuts, [place], cashew, mixed, 1 oz (200 mg).  Nuts, pistachios, 1 oz (295 mg).  Orange, 1 each (240 mg).  Orange juice,  cup (235 mg).  Papaya, medium,  fruit (390 mg).  Peanut butter, chunky, 2 Tbsp (240 mg).  Peanut butter, smooth, 2  Tbsp (210 mg).  Pear, 1 medium (200 mg).  Pomegranate, 1 whole (400 mg).  Pomegranate juice,  cup (215 mg).  Pork, 3 oz (350 mg).  Potato chips, salted, 1 oz (465 mg).  Potato, baked with skin, 1 medium (925 mg).  Potatoes, boiled,  cup (255 mg).  Potatoes, mashed,  cup (330 mg).  Prune juice,  cup (370 mg).  Prunes, 5 each (305 mg).  Pudding, chocolate,  cup (230 mg).  Pumpkin, canned,  cup (250 mg).  Raisins, seedless,  cup (270 mg).  Seeds, sunflower or pumpkin, 1 oz (240 mg).  Soy milk, 1 cup (300 mg).  Spinach,  cup (420 mg).  Spinach, canned,  cup (370 mg).  Sweet potato, baked with skin, 1 medium (450 mg).  Swiss chard,  cup (480 mg).  Tomato or  vegetable juice,  cup (275 mg).  Tomato sauce or puree,  cup (400-550 mg).  Tomato, raw, 1 medium (290 mg).  Tomatoes, canned,  cup (200-300 mg).  [place], 3 oz (250 mg).  Wheat germ, 1 oz (250 mg).  Winter squash,  cup (250 mg).  Yogurt, plain or fruited, 6 oz (260-435 mg).  Zucchini,  cup (220 mg). MODERATE IN POTASSIUM The following foods and beverages have 50-200 mg of potassium per serving:  Apple, 1 each (150 mg).  Apple juice,  cup (150 mg).  Applesauce,  cup (90 mg).  Apricot nectar,  cup (140 mg).  Asparagus, small spears,  cup or 6 spears (155 mg).  Bagel, cinnamon raisin, 1 each (130 mg).  Bagel, egg or plain, 4 in., 1 each (70 mg).  Beans, green,  cup (90 mg).  Beans, yellow,  cup (190 mg).  Beer, regular, 12 oz (100 mg).  Beets, canned,  cup (125 mg).  Blackberries,  cup (115 mg).  Blueberries,  cup (60 mg).  Bread, whole wheat, 1 slice (70 mg).  Broccoli, raw,  cup (145 mg).  Cabbage,  cup (150 mg).  Carrots, cooked or raw,  cup (180 mg).  Cauliflower, raw,  cup (150 mg).  Celery, raw,  cup (155 mg).  Cereal, bran flakes, cup (120-150 mg).  Cheese, cottage,  cup (110 mg).  Cherries, 10 each (150 mg).  Chocolate, 1 oz bar (165 mg).  Coffee, brewed 6 oz (90 mg).  Corn,  cup or 1 ear (195 mg).  Cucumbers,  cup (80 mg).  Egg, large, 1 each (60 mg).  Eggplant,  cup (60 mg).  Endive, raw, cup (80 mg).  English muffin, 1 each (65 mg).  Fish, orange roughy, 3 oz (150 mg).  Frankfurter, beef or pork, 1 each (75 mg).  Fruit cocktail,  cup (115 mg).  Grape juice,  cup (170 mg).  Grapefruit,  fruit (175 mg).  Grapes,  cup (155 mg).  Greens: kale, turnip, collard,  cup (110-150 mg).  Ice cream or frozen yogurt, chocolate,  cup (175 mg).  Ice cream or frozen yogurt, vanilla,  cup (120-150 mg).  Lemons, limes, 1 each (80 mg).  Lettuce, all types, 1 cup (100 mg).  Mixed vegetables,   cup (150 mg).  Mushrooms, raw,  cup (110 mg).  Nuts: walnuts, pecans, or macadamia, 1 oz (125 mg).  Oatmeal,  cup (80 mg).  Okra,  cup (110 mg).  Onions, raw,  cup (120 mg).  Peach, 1 each (185 mg).  Peaches, canned,  cup (120 mg).  Pears, canned,  cup (120 mg).  Peas, green, frozen,  cup (90 mg).  Peppers,  green,  cup (130 mg).  Peppers, red,  cup (160 mg).  Pineapple juice,  cup (165 mg).  Pineapple, fresh or canned,  cup (100 mg).  Plums, 1 each (105 mg).  Pudding, vanilla,  cup (150 mg).  Raspberries,  cup (90 mg).  Rhubarb,  cup (115 mg).  Rice, wild,  cup (80 mg).  Shrimp, 3 oz (155 mg).  Spinach, raw, 1 cup (170 mg).  Strawberries,  cup (125 mg).  Summer squash  cup (175-200 mg).  Swiss chard, raw, 1 cup (135 mg).  Tangerines, 1 each (140 mg).  Tea, brewed, 6 oz (65 mg).  Turnips,  cup (140 mg).  Watermelon,  cup (85 mg).  Wine, red, table, 5 oz (180 mg).  Wine, white, table, 5 oz (100 mg). LOW IN POTASSIUM The following foods and beverages have less than 50 mg of potassium per serving.  Bread, white, 1 slice (30 mg).  Carbonated beverages, 12 oz (less than 5 mg).  Cheese, 1 oz (20-30 mg).  Cranberries,  cup (45 mg).  Cranberry juice cocktail,  cup (20 mg).  Fats and oils, 1 Tbsp (less than 5 mg).  Hummus, 1 Tbsp (32 mg).  Nectar: papaya, mango, or pear,  cup (35 mg).  Rice, white or brown,  cup (50 mg).  Spaghetti or macaroni,  cup cooked (30 mg).  Tortilla, flour or corn, 1 each (50 mg).  Waffle, 4 in., 1 each (50 mg).  Water chestnuts,  cup (40 mg). Document Released: 05/18/2005 Document Revised: 10/09/2013 Document Reviewed: 08/31/2013 [facility] Patient Information 2015 [place], [place]. This information is not intended to replace advice given to you by your health care provider. Make sure you discuss any questions you have with your health care provider.  Peripheral Edema You have swelling in  your legs (peripheral edema). This swelling is due to excess accumulation of salt and water in your body. Edema may be a sign of heart, kidney or liver disease, or a side effect of a medication. It may also be due to problems in the leg veins. Elevating your legs and using special support stockings may be very helpful, if the cause of the swelling is due to poor venous circulation. Avoid long periods of standing, whatever the cause. Treatment of edema depends on identifying the cause. Chips, pretzels, pickles and other salty foods should be avoided. Restricting salt in your diet is almost always needed. Water pills (diuretics) are often used to remove the excess salt and water from your body via urine. These medicines prevent the kidney from reabsorbing sodium. This increases urine flow. Diuretic treatment may also result in lowering of potassium levels in your body. Potassium supplements may be needed if you have to use diuretics daily. Daily weights can help you keep track of your progress in clearing your edema. You should call your caregiver for follow up care as recommended. SEEK IMMEDIATE MEDICAL CARE IF:   You have increased swelling, pain, redness, or heat in your legs.  You develop shortness of breath, especially when lying down.  You develop chest or abdominal pain, weakness, or fainting.  You have a fever. Document Released: 11/11/2004 Document Revised: 12/27/2011 Document Reviewed: 10/22/2009 [facility] Patient Information 2015 [place], [place]. This information is not intended to replace advice given to you by your health care provider. Make sure you discuss any questions you have with your health care provider. Low-Sodium Eating Plan Sodium raises blood pressure and causes water to be held in the body. Getting less sodium  from food will help lower your blood pressure, reduce any swelling, and protect your heart, liver, and kidneys. We get sodium by adding salt (sodium chloride) to food.  Most of our sodium comes from canned, boxed, and frozen foods. Restaurant foods, fast foods, and pizza are also very high in sodium. Even if you take medicine to lower your blood pressure or to reduce fluid in your body, getting less sodium from your food is important. WHAT IS MY PLAN? Most people should limit their sodium intake to 2,300 mg a day. Your health care provider recommends that you limit your sodium intake to __________ a day.  WHAT DO I NEED TO KNOW ABOUT THIS EATING PLAN? For the low-sodium eating plan, you will follow these general guidelines:  Choose foods with a % Daily Value for sodium of less than 5% (as listed on the food label).   Use salt-free seasonings or herbs instead of table salt or sea salt.   Check with your health care provider or pharmacist before using salt substitutes.   Eat fresh foods.  Eat more vegetables and fruits.  Limit canned vegetables. If you do use them, rinse them well to decrease the sodium.   Limit cheese to 1 oz (28 g) per day.   Eat lower-sodium products, often labeled as "lower sodium" or "no salt added."  Avoid foods that contain monosodium glutamate (MSG). MSG is sometimes added to [place] food and some canned foods.  Check food labels (Nutrition Facts labels) on foods to learn how much sodium is in one serving.  Eat more home-cooked food and less restaurant, buffet, and fast food.  When eating at a restaurant, ask that your food be prepared with less salt or none, if possible.  HOW DO I READ FOOD LABELS FOR SODIUM INFORMATION? The Nutrition Facts label lists the amount of sodium in one serving of the food. If you eat more than one serving, you must multiply the listed amount of sodium by the number of servings. Food labels may also identify foods as:  Sodium free--Less than 5 mg in a serving.  Very low sodium--35 mg or less in a serving.  Low sodium--140 mg or less in a serving.  Light in sodium--50% less sodium in  a serving. For example, if a food that usually has 300 mg of sodium is changed to become light in sodium, it will have 150 mg of sodium.  Reduced sodium--25% less sodium in a serving. For example, if a food that usually has 400 mg of sodium is changed to reduced sodium, it will have 300 mg of sodium. WHAT FOODS CAN I EAT? Grains Low-sodium cereals, including oats, puffed wheat and rice, and shredded wheat cereals. Low-sodium crackers. Unsalted rice and pasta. Lower-sodium bread.  Vegetables Frozen or fresh vegetables. Low-sodium or reduced-sodium canned vegetables. Low-sodium or reduced-sodium tomato sauce and paste. Low-sodium or reduced-sodium tomato and vegetable juices.  Fruits Fresh, frozen, and canned fruit. Fruit juice.  Meat and Other Protein Products Low-sodium canned tuna and salmon. Fresh or frozen meat, poultry, seafood, and fish. Lamb. Unsalted nuts. Dried beans, peas, and lentils without added salt. Unsalted canned beans. Homemade soups without salt. Eggs.  Dairy Milk. Soy milk. Ricotta cheese. Low-sodium or reduced-sodium cheeses. Yogurt.  Condiments Fresh and dried herbs and spices. Salt-free seasonings. Onion and garlic powders. Low-sodium varieties of mustard and ketchup. Lemon juice.  Fats and Oils Reduced-sodium salad dressings. Unsalted butter.  Other Unsalted popcorn and pretzels.  The items listed above may not  be a complete list of recommended foods or beverages. Contact your dietitian for more options. WHAT FOODS ARE NOT RECOMMENDED? Grains Instant hot cereals. Bread stuffing, pancake, and biscuit mixes. Croutons. Seasoned rice or pasta mixes. Noodle soup cups. Boxed or frozen macaroni and cheese. Self-rising flour. Regular salted crackers. Vegetables Regular canned vegetables. Regular canned tomato sauce and paste. Regular tomato and vegetable juices. Frozen vegetables in sauces. Salted french fries. Olives. Angie Fava. Relishes. Sauerkraut. Salsa. Meat  and Other Protein Products Salted, canned, smoked, spiced, or pickled meats, seafood, or fish. Bacon, ham, sausage, hot dogs, corned beef, chipped beef, and packaged luncheon meats. Salt pork. Jerky. Pickled herring. Anchovies, regular canned tuna, and sardines. Salted nuts. Dairy Processed cheese and cheese spreads. Cheese curds. Blue cheese and cottage cheese. Buttermilk.  Condiments Onion and garlic salt, seasoned salt, table salt, and sea salt. Canned and packaged gravies. Worcestershire sauce. Tartar sauce. Barbecue sauce. Teriyaki sauce. Soy sauce, including reduced sodium. Steak sauce. Fish sauce. Oyster sauce. Cocktail sauce. Horseradish. Regular ketchup and mustard. Meat flavorings and tenderizers. Bouillon cubes. Hot sauce. Tabasco sauce. Marinades. Taco seasonings. Relishes. Fats and Oils Regular salad dressings. Salted butter. Margarine. Ghee. Bacon fat.  Other Potato and tortilla chips. Corn chips and puffs. Salted popcorn and pretzels. Canned or dried soups. Pizza. Frozen entrees and pot pies.  The items listed above may not be a complete list of foods and beverages to avoid. Contact your dietitian for more information. Document Released: 03/26/2002 Document Revised: 10/09/2013 Document Reviewed: 08/08/2013 [facility] Patient Information 2015 [place], [place]. This information is not intended to replace advice given to you by your health care provider. Make sure you discuss any questions you have with your health care provider. Cooking with Less Salt Cooking with less salt is one way to reduce the amount of sodium you get from food. Sodium raises blood pressure and causes water to be held in the body. Getting less sodium from food may help lower your blood pressure, reduce any swelling, and protect your heart, liver, and kidneys.  WHAT DO I NEED TO KNOW ABOUT COOKING WITH LESS SALT?  Buy sodium-free or low-sodium products. Look on the label for the words:    Lower-sodium.  Sodium-free.   Sodium-reduced.   No salt added.   Unsalted.  Check the food label before using or buying packaged ingredients.  Look for products with no more than 150 mg of sodium in one serving.  Do not choose foods with salt as one of the first three ingredients on the ingredients list. If salt is one of the first three ingredients, it usually means the item is high in sodium because ingredients are listed in order of amount in the food item.  Use herbs, seasonings without salt, and spices as substitutes for salt in foods.  Use sodium-free baking soda when baking. WHAT ARE SOME SALT ALTERNATIVES? The following are herbs, seasonings, and spices that can be used instead of salt to give taste to your food. Next to their names are foods they can be used to flavor.  Herbs  Bay-Soups, meat and vegetable dishes, and spaghetti sauce.   Basil-Italian dishes, soups, pasta, and fish dishes.   Cilantro-Meat, poultry, and vegetable dishes.   Chili Powder-Marinades and Mexican dishes.   Chives-Salad dressings and potato dishes.   Cumin-Mexican dishes, couscous, and meat dishes.   Dill-Fish dishes, sauces, and salads.   Fennel-Meat and vegetable dishes, breads, and cookies.   Garlic (do not use garlic salt)-Italian dishes, meat dishes, salad dressings,  and sauces.   Marjoram-Soups, potato dishes, and meat dishes.   Oregano-Pizza and spaghetti sauce.   Parsley-Salads, soups, pasta, and meat dishes.   Rosemary-Italian dishes, salad dressings, soups, and red meats.   Saffron-Fish dishes, pasta, and some poultry dishes.   Sage-Stuffings and sauces.   Tarragon-Fish and [employer].   Thyme-Stuffing, meat, and fish dishes.  Herbs should be fresh or dried. Do not choose packaged mixes.  Seasonings  Lemon juice-Fish dishes, poultry dishes, vegetables, and salads.   Vinegar-Salad dressings, vegetables, and fish dishes.   Spices  Cinnamon-Sweet dishes (such as cakes, cookies, and puddings).   Cloves-Gingerbread, puddings, and marinades for meats.   Curry-Vegetable dishes, fish and poultry dishes, and stir-fry dishes.   Ginger-Vegetables dishes, fish dishes, and stir-fry dishes.   Nutmeg-Pasta, vegetables, poultry, fish dishes, and custard.  WHAT ARE SOME LOW-SODIUM INGREDIENTS AND FOODS?  Fresh or frozen fruits and vegetables with no sauce added.  Fresh or frozen whole meats, poultry, and fish with no sauce added.  Eggs.  Noodles, pasta, quinoa, rice.  Shredded or puffed wheat or puffed rice.  Regular or quick oats.  Milk, yogurt, low-sodium cheeses and hard cheeses (such as cheddar, [employer], or mozzarella). Always check the label for serving size and sodium content.  Unsalted butter or margarine.  Unsalted nuts.  Sherbet or ice cream (keep to  cup serving).  Homemade pudding.  Sodium-free baking soda and baking powder. This is not a complete list of low-sodium ingredients and foods. Contact your dietitian for more options.  WHAT HIGH-SODIUM INGREDIENTS ARE NOT RECOMMENDED?  Sauces, such as mustard, barbecue sauce, soy sauce, teriyaki sauce, steak sauce, chili sauce, cocktail sauce, and tartar sauce.  Mixes, such as flavored rice.  Instant products, such as ready-made pasta.  Horseradish.  Salsa.   Packaged gravies.   Angie Fava.  Olives.  Sauerkraut.  Salted nuts.   Cured or smoked meats (such as hot dogs, bacon, salami, ham, and bologna).   Processed vegetable juices, such as tomato juice.  Buttermilk.  Processed cheeses (such as cheese dips or cheese spread).  Cottage cheese.  Instant hot cereals.  Dessert mixes (ready-to-make) and store-bought cakes and pies.  Crackers with salted tops. This is not a complete list of high-sodium ingredients. Contact your dietitian for more options.  Document Released: 10/04/2005 Document Revised:  10/09/2013 Document Reviewed: 08/27/2013 [facility] Patient Information 2015 [place], [place]. This information is not intended to replace advice given to you by your health care provider. Make sure you discuss any questions you have with your health care provider.

## 2015-04-14 ENCOUNTER — Ambulatory Visit (HOSPITAL_COMMUNITY)
Admission: RE | Admit: 2015-04-14 | Discharge: 2015-04-14 | Disposition: A | Discharge status: Home / Self Care | Payer: Federal, State, Local not specified - PPO | Source: Ambulatory Visit | Attending: Obstetrics and Gynecology | Admitting: Obstetrics and Gynecology

## 2015-04-14 DIAGNOSIS — Z1231 Encounter for screening mammogram for malignant neoplasm of breast: Secondary | ICD-10-CM | POA: Diagnosis not present

## 2015-04-16 ENCOUNTER — Other Ambulatory Visit: Payer: Self-pay | Admitting: Obstetrics and Gynecology

## 2015-04-16 DIAGNOSIS — R928 Other abnormal and inconclusive findings on diagnostic imaging of breast: Secondary | ICD-10-CM

## 2015-04-22 ENCOUNTER — Ambulatory Visit
Admission: RE | Admit: 2015-04-22 | Discharge: 2015-04-22 | Disposition: A | Discharge status: Home / Self Care | Payer: Federal, State, Local not specified - PPO | Source: Ambulatory Visit | Attending: Obstetrics and Gynecology | Admitting: Obstetrics and Gynecology

## 2015-04-22 DIAGNOSIS — R928 Other abnormal and inconclusive findings on diagnostic imaging of breast: Secondary | ICD-10-CM

## 2015-05-19 ENCOUNTER — Ambulatory Visit (INDEPENDENT_AMBULATORY_CARE_PROVIDER_SITE_OTHER): Payer: Federal, State, Local not specified - PPO | Admitting: Family Medicine

## 2015-05-19 VITALS — BP 132/80 | HR 80 | Temp 98.5°F | Resp 17 | Ht 64.5 in | Wt 232.0 lb

## 2015-05-19 DIAGNOSIS — L02411 Cutaneous abscess of right axilla: Secondary | ICD-10-CM | POA: Diagnosis not present

## 2015-05-19 DIAGNOSIS — L03111 Cellulitis of right axilla: Secondary | ICD-10-CM

## 2015-05-19 MED ORDER — HYDROCODONE-ACETAMINOPHEN 5-325 MG PO TABS: 1.0000 | ORAL_TABLET | Freq: Four times a day (QID) | ORAL | Status: DC | PRN

## 2015-05-19 MED ORDER — CEPHALEXIN 500 MG PO CAPS: 500.0000 mg | ORAL_CAPSULE | Freq: Four times a day (QID) | ORAL | Status: DC

## 2015-05-19 NOTE — Progress Notes (Signed)
Chief Complaint:  Chief Complaint  Patient presents with  . boil in armpit    HPI: Taylor Reynolds is a 54 y.o. female who reports to [facility] today complaining of a history of right arm abscess, worsening pain and fluctuance and redness and pustular drainage. No history of diabetes or abnormal breast exams. She recently had a mammogram that was a 3-D mammogram and was normal. No fevers or chills. She has had a history of a boil in the past but no recurrence for a long time. Again no fevers and chills she has not tried anything for this except warm soaks. 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 There is some swelling in her arm pit but they had it bx before and it was normal. Denies diabetes  Past Medical History  Diagnosis Date  . Chest pressure   . Reflux   . History of measles, mumps, or rubella   . Varicella   . Abnormal Pap smear 06/1989  . Urge incontinence 2001  . H/O pelvic mass 2003  . Ovarian cyst 01/2002  . ASCUS (atypical squamous cells of undetermined significance) on Pap smear 2006  . Pelvic pain 12/2005  . Strain of shoulder, left 04/2006  . 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 Anal itching 05/2009  . History of irregular menstrual bleeding 02/2011  . Asthma    Past Surgical History  Procedure Laterality Date  . Acne cyst removal      Under eye  . Cesarean section     History   Social History  . Marital Status: Divorced    Spouse Name: N/A  . Number of Children: N/A  . Years of Education: N/A   Social History Main Topics  . Smoking status: Never Smoker   . Smokeless tobacco: Never Used  . Alcohol Use: No  . Drug Use: No  . Sexual Activity: Not Currently    Birth Control/ Protection: None   Other Topics Concern  . None   Social History Narrative   Family History  Problem Relation Age of Onset  . Diabetes    . Emphysema Sister   . Stroke Maternal Grandmother   . Heart disease Maternal Grandmother     . Diabetes Father   . Heart disease Maternal Grandfather   . Asthma Maternal Grandfather    Allergies  Allergen Reactions  . Bee Venom Anaphylaxis  . Peanuts [Peanut Oil] Anaphylaxis   Prior to Admission medications   Medication Sig Start Date End Date Taking? Authorizing Provider  albuterol (PROVENTIL HFA;VENTOLIN HFA) 108 (90 BASE) MCG/ACT inhaler Inhale 2 puffs into the lungs every 6 (six) hours as needed. For shortness of breath   Yes Historical Provider, MD  furosemide (LASIX) 20 MG tablet Take 1 tablet (20 mg total) by mouth daily as needed for edema. 03/30/15  Yes Shawnee Knapp, MD  montelukast (SINGULAIR) 10 MG tablet Take 10 mg by mouth daily as needed (allergies).  04/05/13 05/19/15 Yes Historical Provider, MD  Vitamin D, Ergocalciferol, (DRISDOL) 50000 UNITS CAPS capsule Take 50,000 Units by mouth every 30 (thirty) days. 04/05/13  Yes Historical Provider, MD  EPINEPHrine (EPIPEN 2-PAK) 0.3 mg/0.3 mL SOAJ injection 0.3 mg. 04/05/13   Historical Provider, MD     ROS: The patient denies fevers, chills, night sweats, unintentional weight loss, chest pain, palpitations, wheezing, dyspnea on exertion, nausea, vomiting, abdominal pain, dysuria, hematuria, melena, numbness, weakness, or tingling.  All other systems have been reviewed and were otherwise negative with the exception of those mentioned in the HPI and as above.    PHYSICAL EXAM: Filed Vitals:   05/19/15 1441  BP: 132/80  Pulse: 80  Temp: 98.5 F (36.9 C)  Resp: 17   Body mass index is 39.22 kg/(m^2).   General: Alert, no acute distress HEENT:  Normocephalic, atraumatic, oropharynx patent. EOMI, PERRLA Cardiovascular:  Regular rate and rhythm, no rubs murmurs or gallops.  No Carotid bruits, radial pulse intact. No pedal edema.  Respiratory: Clear to auscultation bilaterally.  No wheezes, rales, or rhonchi.  No cyanosis, no use of accessory musculature Abdominal: No organomegaly, abdomen is soft and non-tender, positive  bowel sounds. No masses. Skin: Positive right axillary abscess 1 x 1" erythematous with pustular drainage and head. I believe it is loculated underneath.  Neurologic: Facial musculature symmetric. Psychiatric: Patient acts appropriately throughout our interaction. Lymphatic: No cervical or submandibular lymphadenopathy Musculoskeletal: Gait intact. No edema, tenderness Breast eam on right side normal, she has  Some LAD in right axilla  but that was previosult bx per patietn and was normal  LABS: Results for orders placed or performed during the hospital encounter of 10/26/13  GC/Chlamydia Probe Amp  Result Value Ref Range   CT Probe RNA NEGATIVE NEGATIVE   GC Probe RNA NEGATIVE NEGATIVE  Wet prep, genital  Result Value Ref Range   Yeast Wet Prep HPF POC NONE SEEN NONE SEEN   Trich, Wet Prep NONE SEEN NONE SEEN   Clue Cells Wet Prep HPF POC NONE SEEN NONE SEEN   WBC, Wet Prep HPF POC NONE SEEN NONE SEEN  CBC with Differential  Result Value Ref Range   WBC 5.9 4.0 - 10.5 K/uL   RBC 4.44 3.87 - 5.11 MIL/uL   Hemoglobin 12.8 12.0 - 15.0 g/dL   HCT 38.0 36.0 - 46.0 %   MCV 85.6 78.0 - 100.0 fL   MCH 28.8 26.0 - 34.0 pg   MCHC 33.7 30.0 - 36.0 g/dL   RDW 13.7 11.5 - 15.5 %   Platelets 230 150 - 400 K/uL   Neutrophils Relative % 52 43 - 77 %   Neutro Abs 3.1 1.7 - 7.7 K/uL   Lymphocytes Relative 36 12 - 46 %   Lymphs Abs 2.2 0.7 - 4.0 K/uL   Monocytes Relative 8 3 - 12 %   Monocytes Absolute 0.5 0.1 - 1.0 K/uL   Eosinophils Relative 3 0 - 5 %   Eosinophils Absolute 0.2 0.0 - 0.7 K/uL   Basophils Relative 0 0 - 1 %   Basophils Absolute 0.0 0.0 - 0.1 K/uL  Comprehensive metabolic panel  Result Value Ref Range   Sodium 137 137 - 147 mEq/L   Potassium 4.0 3.7 - 5.3 mEq/L   Chloride 104 96 - 112 mEq/L   CO2 23 19 - 32 mEq/L   Glucose, Bld 97 70 - 99 mg/dL   BUN 16 6 - 23 mg/dL   Creatinine, Ser 0.90 0.50 - 1.10 mg/dL   Calcium 9.1 8.4 - 10.5 mg/dL   Total Protein 7.3 6.0 - 8.3  g/dL   Albumin 3.3 (L) 3.5 - 5.2 g/dL   AST 16 0 - 37 U/L   ALT 15 0 - 35 U/L   Alkaline Phosphatase 55 39 - 117 U/L   Total Bilirubin 0.2 (L) 0.3 - 1.2 mg/dL   GFR calc non Af Amer 73 (L) >90 mL/min   GFR calc  Af Amer 84 (L) >90 mL/min  Urinalysis, Routine w reflex microscopic  Result Value Ref Range   Color, Urine YELLOW YELLOW   APPearance CLEAR CLEAR   Specific Gravity, Urine 1.010 1.005 - 1.030   pH 5.5 5.0 - 8.0   Glucose, UA NEGATIVE NEGATIVE mg/dL   Hgb urine dipstick NEGATIVE NEGATIVE   Bilirubin Urine NEGATIVE NEGATIVE   Ketones, ur NEGATIVE NEGATIVE mg/dL   Protein, ur NEGATIVE NEGATIVE mg/dL   Urobilinogen, UA 0.2 0.0 - 1.0 mg/dL   Nitrite NEGATIVE NEGATIVE   Leukocytes, UA NEGATIVE NEGATIVE  Occult blood, poc device  Result Value Ref Range   Fecal Occult Bld NEGATIVE NEGATIVE     EKG/XRAY:   Primary read interpreted by Dr. Marin Comment at [facility].   ASSESSMENT/PLAN: Encounter Diagnoses  Name Primary?  Marland Kitchen Abscess of right axilla Yes  . Cellulitis of right axilla    S/p IandD after VCO, tolerated procedure well Note for out of work 2 days Prescription for Keflex, Norco Wound cx, wound care and fu as directed  Gross sideeffects, risk and benefits, and alternatives of medications d/w patient. Patient is aware that all medications have potential sideeffects and we are unable to predict every sideeffect or drug-drug interaction that may occur.  Breckin Savannah DO  05/19/2015 3:13 PM

## 2015-05-19 NOTE — Patient Instructions (Addendum)
Please change the dressing in 24 hours, and keep the dressing dry.  You will return in 2 days (48hours) Abscess An abscess is an infected area that contains a collection of pus and debris.It can occur in almost any part of the body. An abscess is also known as a furuncle or boil. CAUSES  An abscess occurs when tissue gets infected. This can occur from blockage of oil or sweat glands, infection of hair follicles, or a minor injury to the skin. As the body tries to fight the infection, pus collects in the area and creates pressure under the skin. This pressure causes pain. People with weakened immune systems have difficulty fighting infections and get certain abscesses more often.  SYMPTOMS Usually an abscess develops on the skin and becomes a painful mass that is red, warm, and tender. If the abscess forms under the skin, you may feel a moveable soft area under the skin. Some abscesses break open (rupture) on their own, but most will continue to get worse without care. The infection can spread deeper into the body and eventually into the bloodstream, causing you to feel ill.  DIAGNOSIS  Your caregiver will take your medical history and perform a physical exam. A sample of fluid may also be taken from the abscess to determine what is causing your infection. TREATMENT  Your caregiver may prescribe antibiotic medicines to fight the infection. However, taking antibiotics alone usually does not cure an abscess. Your caregiver may need to make a small cut (incision) in the abscess to drain the pus. In some cases, gauze is packed into the abscess to reduce pain and to continue draining the area. HOME CARE INSTRUCTIONS   Only take over-the-counter or prescription medicines for pain, discomfort, or fever as directed by your caregiver.  If you were prescribed antibiotics, take them as directed. Finish them even if you start to feel better.  If gauze is used, follow your caregiver's directions for changing the  gauze.  To avoid spreading the infection:  Keep your draining abscess covered with a bandage.  Wash your hands well.  Do not share personal care items, towels, or whirlpools with others.  Avoid skin contact with others.  Keep your skin and clothes clean around the abscess.  Keep all follow-up appointments as directed by your caregiver. SEEK MEDICAL CARE IF:   You have increased pain, swelling, redness, fluid drainage, or bleeding.  You have muscle aches, chills, or a general ill feeling.  You have a fever. MAKE SURE YOU:   Understand these instructions.  Will watch your condition.  Will get help right away if you are not doing well or get worse. Document Released: 07/14/2005 Document Revised: 04/04/2012 Document Reviewed: 12/17/2011 [facility] Patient Information 2015 [place], [place]. This information is not intended to replace advice given to you by your health care provider. Make sure you discuss any questions you have with your health care provider.

## 2015-05-21 ENCOUNTER — Ambulatory Visit (INDEPENDENT_AMBULATORY_CARE_PROVIDER_SITE_OTHER): Payer: Federal, State, Local not specified - PPO | Admitting: Physician Assistant

## 2015-05-21 ENCOUNTER — Telehealth: Payer: Self-pay

## 2015-05-21 VITALS — BP 126/74 | HR 70 | Temp 98.8°F | Resp 18 | Ht 64.75 in | Wt 230.0 lb

## 2015-05-21 DIAGNOSIS — Z4801 Encounter for change or removal of surgical wound dressing: Secondary | ICD-10-CM

## 2015-05-21 DIAGNOSIS — Z9889 Other specified postprocedural states: Secondary | ICD-10-CM

## 2015-05-21 DIAGNOSIS — L02411 Cutaneous abscess of right axilla: Secondary | ICD-10-CM

## 2015-05-21 NOTE — Telephone Encounter (Signed)
Patient seen today by Colletta Maryland and she needs to know if she can go to work tonight at 8:30pm or stay home. Cb# [phone]

## 2015-05-22 ENCOUNTER — Encounter: Payer: Self-pay | Admitting: Physician Assistant

## 2015-05-22 LAB — WOUND CULTURE: Gram Stain: NONE SEEN

## 2015-05-22 NOTE — Progress Notes (Signed)
[facility] [address], [place] [address] [phone]  Date:  05/21/2015   Name:  Taylor Reynolds   DOB:  1961/05/16   MRN:  [id number]  PCP:  PROVIDER NOT IN SYSTEM    History of Present Illness:  Taylor Reynolds is a 54 y.o. female patient who presents to [facility] for follow up of wound care of abscess of right axilla status post incision and drainage.  She states that the pain is much better.  She has changed the dressing once and keeping the wound clean and dry.  She has had no fever, excessive pain or drainage.  She is compliant on abx.       Patient Active Problem List   Diagnosis Date Noted  . Neck pain 04/05/2012  . Vitamin D deficiency disease 01/07/2012  . Obesity (BMI 35.0-39.9 without comorbidity) 01/07/2012  . Chest pain 02/12/2011  . Asthma 02/12/2011    Past Medical History  Diagnosis Date  . Chest pressure   . Reflux   . History of measles, mumps, or rubella   . Varicella   . Abnormal Pap smear 06/1989  . Urge incontinence 2001  . H/O pelvic mass 2003  . Ovarian cyst 01/2002  . ASCUS (atypical squamous cells of undetermined significance) on Pap smear 2006  . Pelvic pain 12/2005  . Strain of shoulder, left 04/2006  . 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 Anal itching 05/2009  . History of irregular menstrual bleeding 02/2011  . Asthma     Past Surgical History  Procedure Laterality Date  . Acne cyst removal      Under eye  . Cesarean section      History  Substance Use Topics  . Smoking status: Never Smoker   . Smokeless tobacco: Never Used  . Alcohol Use: No    Family History  Problem Relation Age of Onset  . Diabetes    . Emphysema Sister   . Stroke Maternal Grandmother   . Heart disease Maternal Grandmother   . Diabetes Father   . Heart disease Maternal Grandfather   . Asthma Maternal Grandfather     Allergies  Allergen Reactions  . Bee Venom Anaphylaxis  . Peanuts [Peanut Oil] Anaphylaxis    Medication list  has been reviewed and updated.  Current Outpatient Prescriptions on File Prior to Visit  Medication Sig Dispense Refill  . albuterol (PROVENTIL HFA;VENTOLIN HFA) 108 (90 BASE) MCG/ACT inhaler Inhale 2 puffs into the lungs every 6 (six) hours as needed. For shortness of breath    . cephALEXin (KEFLEX) 500 MG capsule Take 1 capsule (500 mg total) by mouth 4 (four) times daily. 40 capsule 0  . EPINEPHrine (EPIPEN 2-PAK) 0.3 mg/0.3 mL SOAJ injection 0.3 mg.    . furosemide (LASIX) 20 MG tablet Take 1 tablet (20 mg total) by mouth daily as needed for edema. 30 tablet 0  . HYDROcodone-acetaminophen (NORCO) 5-325 MG per tablet Take 1 tablet by mouth every 6 (six) hours as needed for moderate pain. May cause constipation, do no take extra tylenol 30 tablet 0  . montelukast (SINGULAIR) 10 MG tablet Take 10 mg by mouth daily as needed (allergies).     . Vitamin D, Ergocalciferol, (DRISDOL) 50000 UNITS CAPS capsule Take 50,000 Units by mouth every 30 (thirty) days.     No current facility-administered medications on file prior to visit.    ROS ROS otherwise unremarkable unless listed above.   Physical Examination: BP 126/74  mmHg  Pulse 70  Temp(Src) 98.8 F (37.1 C) (Oral)  Resp 18  Ht 5' 4.75" (1.645 m)  Wt 230 lb (104.327 kg)  BMI 38.55 kg/m2  SpO2 99%  LMP 05/01/2015 Ideal Body Weight: Weight in (lb) to have BMI = 25: 148.8  Physical Exam Alert, cooperative, and oriented x4 in NAD.  Right axilla with wound with purulent fluid along packing strip with excision.  No purulent drainage or necrosis seen.  Wound with 1.5cm depth.  Irrigated with normal saline.  Packed gently.  Dressing reapplied.    Assessment and Plan: 54 year old female is here today for wound care follow up of right axilla cellulitis and abscess.  Advised to continue keflex.  Wound culture pending.  She will rtc in 48 hours for wound care follow up.    Abscess of right axilla  Status post incision and  drainage   Ivar Drape, PA-C [facility] 05/22/2015 10:53 PM

## 2015-05-22 NOTE — Telephone Encounter (Signed)
I just saw this.

## 2015-05-23 ENCOUNTER — Ambulatory Visit (INDEPENDENT_AMBULATORY_CARE_PROVIDER_SITE_OTHER): Payer: Federal, State, Local not specified - PPO | Admitting: Urgent Care

## 2015-05-23 VITALS — BP 106/78 | HR 66 | Temp 98.3°F | Resp 16 | Ht 64.0 in | Wt 233.0 lb

## 2015-05-23 DIAGNOSIS — L02411 Cutaneous abscess of right axilla: Secondary | ICD-10-CM

## 2015-05-23 NOTE — Patient Instructions (Signed)
Incision and Drainage Incision and drainage is a procedure in which a sac-like structure (cystic structure) is opened and drained. The area to be drained usually contains material such as pus, fluid, or blood.  LET YOUR CAREGIVER KNOW ABOUT:   Allergies to medicine.  Medicines taken, including vitamins, herbs, eyedrops, over-the-counter medicines, and creams.  Use of steroids (by mouth or creams).  Previous problems with anesthetics or numbing medicines.  History of bleeding problems or blood clots.  Previous surgery.  Other health problems, including diabetes and kidney problems.  Possibility of pregnancy, if this applies. RISKS AND COMPLICATIONS  Pain.  Bleeding.  Scarring.  Infection. BEFORE THE PROCEDURE  You may need to have an ultrasound or other imaging tests to see how large or deep your cystic structure is. Blood tests may also be used to determine if you have an infection or how severe the infection is. You may need to have a tetanus shot. PROCEDURE  The affected area is cleaned with a cleaning fluid. The cyst area will then be numbed with a medicine (local anesthetic). A small incision will be made in the cystic structure. A syringe or catheter may be used to drain the contents of the cystic structure, or the contents may be squeezed out. The area will then be flushed with a cleansing solution. After cleansing the area, it is often gently packed with a gauze or another wound dressing. Once it is packed, it will be covered with gauze and tape or some other type of wound dressing. AFTER THE PROCEDURE   Often, you will be allowed to go home right after the procedure.  You may be given antibiotic medicine to prevent or heal an infection.  If the area was packed with gauze or some other wound dressing, you will likely need to come back in 1 to 2 days to get it removed.  The area should heal in about 14 days. Document Released: 03/30/2001 Document Revised: 04/04/2012  Document Reviewed: 11/29/2011 [employer] Patient Information 2015 [employer]. This information is not intended to replace advice given to you by your health care provider. Make sure you discuss any questions you have with your health care provider.  

## 2015-05-23 NOTE — Progress Notes (Signed)
    MRN: [id number] DOB: 04/05/1961  Subjective:   Taylor Reynolds is a 54 y.o. female presenting for follow up on I&D of abscess performed on 05/19/2015. Reports significant improvement in her pain and abscess in general. Has been taking Keflex. Denies fever, redness, swelling. Denies any other aggravating or relieving factors, no other questions or concerns.  Taylor Reynolds has a current medication list which includes the following prescription(s): albuterol, cephalexin, epinephrine, furosemide, hydrocodone-acetaminophen, vitamin d (ergocalciferol), and montelukast. She is allergic to bee venom and peanuts.  Taylor Reynolds  has a past medical history of Chest pressure; Reflux; History of measles, mumps, or rubella; Varicella; Abnormal Pap smear (06/1989); Urge incontinence (2001); H/O pelvic mass (2003); Ovarian cyst (01/2002); ASCUS (atypical squamous cells of undetermined significance) on Pap smear (2006); Pelvic pain (12/2005); Strain of shoulder, left (04/2006); Axillary mass, right (05/2008); Anal itching (05/2009); History of irregular menstrual bleeding (02/2011); and Asthma. Also  has past surgical history that includes Acne cyst removal and Cesarean section.  ROS As in subjective.  Objective:   Vitals: BP 106/78 mmHg  Pulse 66  Temp(Src) 98.3 F (36.8 C) (Oral)  Resp 16  Ht 5\' 4"  (1.626 m)  Wt 233 lb (105.688 kg)  BMI 39.97 kg/m2  SpO2 98%  LMP 05/01/2015  Physical Exam  Constitutional: She is oriented to person, place, and time. She appears well-developed and well-nourished.  Cardiovascular: Normal rate.   Pulmonary/Chest: Effort normal.  Neurological: She is alert and oriented to person, place, and time.  Skin: Skin is warm and dry. No rash noted. No erythema. No pallor.   WOUND CARE: I&D of right axillary abscess Dressing removed, packing removed which contained moderate amount of purulent material. Wound expressed without drainage or pus, explored for loculations. Wound irrigated  with 20cc of sterile water. Packed loosely with 1/4" packing. Cleansed and dressed.  Assessment and Plan :   1. Abscess of right axilla - Continue antibiotic, patient is doing very well. Anticipatory guidance provided. Patient is to return in 2 days, expect that she'll no longer need packing.  Jaynee Eagles, PA-C [facility] [phone] 05/23/2015 3:21 PM

## 2015-05-23 NOTE — Telephone Encounter (Signed)
Spoke with pt, she did go to work that night and will be in today for follow up.

## 2015-05-23 NOTE — Telephone Encounter (Signed)
She is able to go to work.  Please alert patient.  This did not appear to be an issue to patient for axillary abscess.

## 2015-05-25 ENCOUNTER — Ambulatory Visit (INDEPENDENT_AMBULATORY_CARE_PROVIDER_SITE_OTHER): Payer: Federal, State, Local not specified - PPO | Admitting: Physician Assistant

## 2015-05-25 VITALS — BP 124/84 | HR 84 | Temp 98.7°F | Resp 16 | Ht 64.0 in | Wt 233.0 lb

## 2015-05-25 DIAGNOSIS — L02411 Cutaneous abscess of right axilla: Secondary | ICD-10-CM

## 2015-05-26 NOTE — Progress Notes (Signed)
   05/26/2015 at 11:12 AM  Taylor Reynolds / DOB: 1961-08-27 / MRN: [id number]  The patient has Chest pain; Asthma; Vitamin D deficiency disease; Obesity (BMI 35.0-39.9 without comorbidity); and Neck pain on her problem list.  SUBJECTIVE  Taylor Reynolds wis a 54 y.o. well appearing female presenting for the chief complaint of wound care.  Had a left axillary abscess drained ~4 days ago.  Today she feels well and denies tenderness, exudate, wound purulence. Is taking Keflex without any adverse effects.        She  has a past medical history of Chest pressure; Reflux; History of measles, mumps, or rubella; Varicella; Abnormal Pap smear (06/1989); Urge incontinence (2001); H/O pelvic mass (2003); Ovarian cyst (01/2002); ASCUS (atypical squamous cells of undetermined significance) on Pap smear (2006); Pelvic pain (12/2005); Strain of shoulder, left (04/2006); Axillary mass, right (05/2008); Anal itching (05/2009); History of irregular menstrual bleeding (02/2011); and Asthma.    Medications reviewed and updated by myself where necessary, and exist elsewhere in the encounter.   [name] is allergic to bee venom and peanuts. She  reports that she has never smoked. She has never used smokeless tobacco. She reports that she does not drink alcohol or use illicit drugs. She  reports that she does not currently engage in sexual activity. She reports using the following method of birth control/protection: None. The patient  has past surgical history that includes Acne cyst removal and Cesarean section.  Her family history includes Asthma in her maternal grandfather; Diabetes in her father and another family member; Emphysema in her sister; Heart disease in her maternal grandfather and maternal grandmother; Stroke in her maternal grandmother.  Review of Systems  Constitutional: Negative for fever.  Gastrointestinal: Negative for nausea.  Skin: Negative for itching.  Neurological: Negative for dizziness and  headaches.    OBJECTIVE  Her  height is 5\' 4"  (1.626 m) and weight is 233 lb (105.688 kg). Her oral temperature is 98.7 F (37.1 C). Her blood pressure is 124/84 and her pulse is 84. Her respiration is 16 and oxygen saturation is 99%.  The patient's body mass index is 39.97 kg/(m^2).  Physical Exam  Constitutional: She is oriented to person, place, and time. She appears well-developed and well-nourished. No distress.  Eyes: Conjunctivae and EOM are normal. Pupils are equal, round, and reactive to light.  Neck: No thyromegaly present.  Cardiovascular: Normal rate.   Respiratory: Effort normal.  GI: She exhibits no distension.  Musculoskeletal: Normal range of motion.  Neurological: She is alert and oriented to person, place, and time. She has normal reflexes.  Skin: Skin is warm and dry. She is not diaphoretic.  S/P I&D right axillary.  Wound without exudate, tenderness.  The wound is very shallow.  Packing pulled and clean dressing placed.      No results found for this or any previous visit (from the past 24 hour(s)).  ASSESSMENT & PLAN  [name] was seen today for wound check.  Diagnoses and all orders for this visit:  Abscess of right axilla: Packing pulled.  Patient advised to continue Abx therapy until finished and follow up as needed.     The patient was advised to call or come back to clinic if she does not see an improvement in symptoms, or worsens with the above plan.   Philis Fendt, MHS, PA-C [facility] 05/26/2015 11:12 AM

## 2015-06-02 NOTE — Addendum Note (Signed)
Addended by: Jaynee Eagles on: 06/02/2015 11:42 AM   Modules accepted: Level of Service

## 2015-08-28 ENCOUNTER — Ambulatory Visit (INDEPENDENT_AMBULATORY_CARE_PROVIDER_SITE_OTHER): Payer: Federal, State, Local not specified - PPO

## 2015-08-28 ENCOUNTER — Ambulatory Visit (INDEPENDENT_AMBULATORY_CARE_PROVIDER_SITE_OTHER): Payer: Federal, State, Local not specified - PPO | Admitting: Family Medicine

## 2015-08-28 ENCOUNTER — Telehealth: Payer: Self-pay | Admitting: Family Medicine

## 2015-08-28 VITALS — BP 128/80 | HR 75 | Temp 98.2°F | Resp 16 | Ht 64.0 in | Wt 234.0 lb

## 2015-08-28 DIAGNOSIS — J4520 Mild intermittent asthma, uncomplicated: Secondary | ICD-10-CM | POA: Diagnosis not present

## 2015-08-28 DIAGNOSIS — R05 Cough: Secondary | ICD-10-CM

## 2015-08-28 DIAGNOSIS — R0789 Other chest pain: Secondary | ICD-10-CM | POA: Diagnosis not present

## 2015-08-28 DIAGNOSIS — R059 Cough, unspecified: Secondary | ICD-10-CM

## 2015-08-28 MED ORDER — BENZONATATE 100 MG PO CAPS: 100.0000 mg | ORAL_CAPSULE | Freq: Three times a day (TID) | ORAL | Status: DC | PRN

## 2015-08-28 NOTE — Progress Notes (Signed)
Subjective:    Patient ID: Taylor Reynolds, female    DOB: [birth year], 54 y.o.   MRN: [id number] This chart was scribed for Merri Ray, MD by Zola Button, Medical Scribe. This patient was seen in Room 1 and the patient's care was started at 12:27 PM.    HPI HPI Comments: Taylor Reynolds is a 54 y.o. female with a history of asthma, obesity, and GERD who presents to the [facility] complaining of gradual onset, improving cough that started 11 days ago. Patient reports having associated congestion and rhinorrhea. She also began having upper back pain 4 days ago. Patient did have some SOB 3-4 days ago but has been taking albuterol about once a day since her symptoms started; she last used it this morning. She does not take albuterol regularly. She has not taken any medications for the back pain or URI symptoms. Patient denies fever. She also denies recent heartburn and history of heart failure. She has some concern for pneumonia.   Patient Active Problem List   Diagnosis Date Noted  . Acid reflux 03/08/2013  . Neck pain 04/05/2012  . Vitamin D deficiency disease 01/07/2012  . Obesity (BMI 35.0-39.9 without comorbidity) ([place]) 01/07/2012  . Chest pain 02/12/2011  . Asthma 02/12/2011   Past Medical History  Diagnosis Date  . Chest pressure   . Reflux   . History of measles, mumps, or rubella   . Varicella   . Abnormal Pap smear 06/1989  . Urge incontinence 2001  . H/O pelvic mass 2003  . Ovarian cyst 01/2002  . ASCUS (atypical squamous cells of undetermined significance) on Pap smear 2006  . Pelvic pain 12/2005  . Strain of shoulder, left 04/2006  . 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 Anal itching 05/2009  . History of irregular menstrual bleeding 02/2011  . Asthma    Past Surgical History  Procedure Laterality Date  . Acne cyst removal      Under eye  . Cesarean section     Allergies  Allergen Reactions  . Bee Venom Anaphylaxis  . Peanuts [Peanut Oil]  Anaphylaxis   Prior to Admission medications   Medication Sig Start Date End Date Taking? Authorizing Provider  albuterol (PROVENTIL HFA;VENTOLIN HFA) 108 (90 BASE) MCG/ACT inhaler Inhale 2 puffs into the lungs every 6 (six) hours as needed. For shortness of breath   Yes Historical Provider, MD  EPINEPHrine (EPIPEN 2-PAK) 0.3 mg/0.3 mL SOAJ injection 0.3 mg. 04/05/13  Yes Historical Provider, MD  Vitamin D, Ergocalciferol, (DRISDOL) 50000 UNITS CAPS capsule Take 50,000 Units by mouth every 30 (thirty) days. 04/05/13  Yes Historical Provider, MD  cephALEXin (KEFLEX) 500 MG capsule Take 1 capsule (500 mg total) by mouth 4 (four) times daily. Patient not taking: Reported on 08/28/2015 05/19/15   Thao P Le, DO  furosemide (LASIX) 20 MG tablet Take 1 tablet (20 mg total) by mouth daily as needed for edema. Patient not taking: Reported on 08/28/2015 03/30/15   Shawnee Knapp, MD  HYDROcodone-acetaminophen [facility]) 5-325 MG per tablet Take 1 tablet by mouth every 6 (six) hours as needed for moderate pain. May cause constipation, do no take extra tylenol Patient not taking: Reported on 08/28/2015 05/19/15   Thao P Le, DO  montelukast (SINGULAIR) 10 MG tablet Take 10 mg by mouth daily as needed (allergies).  04/05/13 05/21/15  Historical Provider, MD   Social History   Social History  . Marital Status: Divorced    Spouse  Name: N/A  . Number of Children: N/A  . Years of Education: N/A   Occupational History  . Not on file.   Social History Main Topics  . Smoking status: Never Smoker   . Smokeless tobacco: Never Used  . Alcohol Use: No  . Drug Use: No  . Sexual Activity: Not Currently    Birth Control/ Protection: None   Other Topics Concern  . Not on file   Social History Narrative     Review of Systems  Constitutional: Negative for fever.  HENT: Positive for congestion and rhinorrhea.   Respiratory: Positive for cough.   Musculoskeletal: Positive for back pain.       Objective:   Physical  Exam  Constitutional: She is oriented to person, place, and time. She appears well-developed and well-nourished. No distress.  HENT:  Head: Normocephalic and atraumatic.  Right Ear: Hearing, tympanic membrane, external ear and ear canal normal.  Left Ear: Hearing, tympanic membrane, external ear and ear canal normal.  Nose: Nose normal.  Mouth/Throat: Oropharynx is clear and moist. No oropharyngeal exudate.  Minimal cerumen in left canal and right canal, visualized TM appears normal.  Eyes: Conjunctivae and EOM are normal. Pupils are equal, round, and reactive to light.  Cardiovascular: Normal rate, regular rhythm, normal heart sounds and intact distal pulses.   No murmur heard. Pulmonary/Chest: Effort normal. No respiratory distress. She has wheezes. She has no rhonchi.  Few coarse breath sounds, right lower lobe, with expiratory wheeze.  Musculoskeletal: She exhibits edema and tenderness.  1+ edema lower extremities bilaterally. Right upper paraspinals and rhomboids tender, reproducing pain.  Neurological: She is alert and oriented to person, place, and time.  Skin: Skin is warm and dry. No rash noted.  Psychiatric: She has a normal mood and affect. Her behavior is normal.  Vitals reviewed.   [facility] (PRIMARY) x-ray report read by Dr. Carlota Raspberry: CXR - few increased perihilar markings in right lower lobe without discrete infiltrate. No appreciable change from May, 2014.  Filed Vitals:   08/28/15 1138  BP: 128/80  Pulse: 75  Temp: 98.2 F (36.8 C)  TempSrc: Oral  Resp: 16  Height: 5\' 4"  (1.626 m)  Weight: 234 lb (106.142 kg)  SpO2: 99%       Assessment & Plan:   Taylor Reynolds is a 54 y.o. female Cough - Plan: DG Chest 2 View, benzonatate (TESSALON) 100 MG capsule  Chest wall pain - Plan: DG Chest 2 View  Asthma, mild intermittent, uncomplicated  Suspected viral URI with secondary asthma flare. Overall cough has improved, does have some wheezing on exam but normal O2 sat  normal effort and distress. Has not used albuterol since early this morning. No apparent infiltrate on chest x-ray, and is afebrile and improving, doubt bacterial cause at this point.   -Can continue to use albuterol up to every 4-6 hours as needed for wheezing or cough, RTC/ER precautions discussed if frequent or persistent use.   -Tessalon Perles 3 times a day when necessary cough,   -Advil or Aleve when necessary for chest wall pain if Tylenol does help help first. Handout given on chest wall pain and other conditions above. RTC precautions.  Meds ordered this encounter  Medications  . benzonatate (TESSALON) 100 MG capsule    Sig: Take 1 capsule (100 mg total) by mouth 3 (three) times daily as needed for cough.    Dispense:  20 capsule    Refill:  0   Patient Instructions  Overall your x-ray looks stable from previous x-ray. Use albuterol up to every 4-6 hours as needed, but if you have to use this medication more than 3 times per day, or persistent use for more than 3 days in a row, we may need to start prednisone for your asthma. If you have fever, worsening cough, or feeling worse, return for recheck as secondary pneumonia or bronchitis is possible. As you cough is improving as of today, no antibiotics should be needed at this point.  Your upper back pain is likely due to pain in the muscles or the chest wall from coughing.  Okay to take Advil or Aleve for short-term if needed for the soreness if Tylenol does not help first. Heat or ice to the affected area as needed. As above use albuterol ifyou are wheezing as this is the cause of cough, or the [employer] as I prescribed up to 3 times per day can also help cough.  Asthma, Acute Bronchospasm Acute bronchospasm caused by asthma is also referred to as an asthma attack. Bronchospasm means your air passages become narrowed. The narrowing is caused by inflammation and tightening of the muscles in the air tubes (bronchi) in your lungs. This  can make it hard to breathe or cause you to wheeze and cough. CAUSES Possible triggers are:  Animal dander from the skin, hair, or feathers of animals.  Dust mites contained in house dust.  Cockroaches.  Pollen from trees or grass.  Mold.  Cigarette or tobacco smoke.  Air pollutants such as dust, household cleaners, hair sprays, aerosol sprays, paint fumes, strong chemicals, or strong odors.  Cold air or weather changes. Cold air may trigger inflammation. Winds increase molds and pollens in the air.  Strong emotions such as crying or laughing hard.  Stress.  Certain medicines such as aspirin or beta-blockers.  Sulfites in foods and drinks, such as dried fruits and wine.  Infections or inflammatory conditions, such as a flu, cold, or inflammation of the nasal membranes (rhinitis).  Gastroesophageal reflux disease (GERD). GERD is a condition where stomach acid backs up into your esophagus.  Exercise or strenuous activity. SIGNS AND SYMPTOMS   Wheezing.  Excessive coughing, particularly at night.  Chest tightness.  Shortness of breath. DIAGNOSIS  Your health care provider will ask you about your medical history and perform a physical exam. A chest X-ray or blood testing may be performed to look for other causes of your symptoms or other conditions that may have triggered your asthma attack. TREATMENT  Treatment is aimed at reducing inflammation and opening up the airways in your lungs. Most asthma attacks are treated with inhaled medicines. These include quick relief or rescue medicines (such as bronchodilators) and controller medicines (such as inhaled corticosteroids). These medicines are sometimes given through an inhaler or a nebulizer. Systemic steroid medicine taken by mouth or given through an IV tube also can be used to reduce the inflammation when an attack is moderate or severe. Antibiotic medicines are only used if a bacterial infection is present.  HOME CARE  INSTRUCTIONS   Rest.  Drink plenty of liquids. This helps the mucus to remain thin and be easily coughed up. Only use caffeine in moderation and do not use alcohol until you have recovered from your illness.  Do not smoke. Avoid being exposed to secondhand smoke.  You play a critical role in keeping yourself in good health. Avoid exposure to things that cause you to wheeze or to have breathing problems.  Keep your medicines up-to-date and available. Carefully follow your health care provider's treatment plan.  Take your medicine exactly as prescribed.  When pollen or pollution is bad, keep windows closed and use an air conditioner or go to places with air conditioning.  Asthma requires careful medical care. See your health care provider for a follow-up as advised. If you are more than [redacted] weeks pregnant and you were prescribed any new medicines, let your obstetrician know about the visit and how you are doing. Follow up with your health care provider as directed.  After you have recovered from your asthma attack, make an appointment with your outpatient doctor to talk about ways to reduce the likelihood of future attacks. If you do not have a doctor who manages your asthma, make an appointment with a primary care doctor to discuss your asthma. SEEK IMMEDIATE MEDICAL CARE IF:   You are getting worse.  You have trouble breathing. If severe, call your local emergency services (911 in the [place]  You develop chest pain or discomfort.  You are vomiting.  You are not able to keep fluids down.  You are coughing up yellow, green, brown, or bloody sputum.  You have a fever and your symptoms suddenly get worse.  You have trouble swallowing. MAKE SURE YOU:   Understand these instructions.  Will watch your condition.  Will get help right away if you are not doing well or get worse.   This information is not intended to replace advice given to you by your health care provider. Make sure  you discuss any questions you have with your health care provider.   Document Released: 01/19/2007 Document Revised: 10/09/2013 Document Reviewed: 04/11/2013 Elsevier Interactive Patient Education 2016 Elsevier Inc[employer] Cough, Adult Coughing is a reflex that clears your throat and your airways. Coughing helps to heal and protect your lungs. It is normal to cough occasionally, but a cough that happens with other symptoms or lasts a long time may be a sign of a condition that needs treatment. A cough may last only 2-3 weeks (acute), or it may last longer than 8 weeks (chronic). CAUSES Coughing is commonly caused by:  Breathing in substances that irritate your lungs.  A viral or bacterial respiratory infection.  Allergies.  Asthma.  Postnasal drip.  Smoking.  Acid backing up from the stomach into the esophagus (gastroesophageal reflux).  Certain medicines.  Chronic lung problems, including COPD (or rarely, lung cancer).  Other medical conditions such as heart failure. HOME CARE INSTRUCTIONS  Pay attention to any changes in your symptoms. Take these actions to help with your discomfort:  Take medicines only as told by your health care provider.  If you were prescribed an antibiotic medicine, take it as told by your health care provider. Do not stop taking the antibiotic even if you start to feel better.  Talk with your health care provider before you take a cough suppressant medicine.  Drink enough fluid to keep your urine clear or pale yellow.  If the air is dry, use a cold steam vaporizer or humidifier in your bedroom or your home to help loosen secretions.  Avoid anything that causes you to cough at work or at home.  If your cough is worse at night, try sleeping in a semi-upright position.  Avoid cigarette smoke. If you smoke, quit smoking. If you need help quitting, ask your health care provider.  Avoid caffeine.  Avoid alcohol.  Rest as needed. SEEK MEDICAL CARE IF:  You have new symptoms.  You cough up pus.  Your cough does not get better after 2-3 weeks, or your cough gets worse.  You cannot control your cough with suppressant medicines and you are losing sleep.  You develop pain that is getting worse or pain that is not controlled with pain medicines.  You have a fever.  You have unexplained weight loss.  You have night sweats. SEEK IMMEDIATE MEDICAL CARE IF:  You cough up blood.  You have difficulty breathing.  Your heartbeat is very fast.   This information is not intended to replace advice given to you by your health care provider. Make sure you discuss any questions you have with your health care provider.   Document Released: 04/02/2011 Document Revised: 06/25/2015 Document Reviewed: 12/11/2014 Elsevier Interactive Patient Education 2016 Elsevier Inc.  Chest Wall Pain Chest wall pain is pain in or around the bones and muscles of your chest. Sometimes, an injury causes this pain. Sometimes, the cause may not be known. This pain may take several weeks or longer to get better. HOME CARE INSTRUCTIONS  Pay attention to any changes in your symptoms. Take these actions to help with your pain:   Rest as told by your health care provider.   Avoid activities that cause pain. These include any activities that use your chest muscles or your abdominal and side muscles to lift heavy items.   If directed, apply ice to the painful area:  Put ice in a plastic bag.  Place a towel between your skin and the bag.  Leave the ice on for 20 minutes, 2-3 times per day.  Take over-the-counter and prescription medicines only as told by your health care provider.  Do not use tobacco products, including cigarettes, chewing tobacco, and e-cigarettes. If you need help quitting, ask your health care provider.  Keep all follow-up visits as told by your health care provider. This is important. SEEK MEDICAL CARE IF:  You have a fever.  Your  chest pain becomes worse.  You have new symptoms. SEEK IMMEDIATE MEDICAL CARE IF:  You have nausea or vomiting.  You feel sweaty or light-headed.  You have a cough with phlegm (sputum) or you cough up blood.  You develop shortness of breath.   This information is not intended to replace advice given to you by your health care provider. Make sure you discuss any questions you have with your health care provider.   Document Released: 10/04/2005 Document Revised: 06/25/2015 Document Reviewed: 12/30/2014 Elsevier Interactive Patient Education [employer].    I personally performed the services described in this documentation, which was scribed in my presence. The recorded information has been reviewed and considered, and addended by me as needed.    By signing my name below, I, Zola Button, attest that this documentation has been prepared under the direction and in the presence of Merri Ray, MD.  Electronically Signed: Zola Button, Medical Scribe. 08/28/2015. 12:27 PM.

## 2015-08-28 NOTE — Patient Instructions (Signed)
Overall your x-ray looks stable from previous x-ray. Use albuterol up to every 4-6 hours as needed, but if you have to use this medication more than 3 times per day, or persistent use for more than 3 days in a row, we may need to start prednisone for your asthma. If you have fever, worsening cough, or feeling worse, return for recheck as secondary pneumonia or bronchitis is possible. As you cough is improving as of today, no antibiotics should be needed at this point.  Your upper back pain is likely due to pain in the muscles or the chest wall from coughing.  Okay to take Advil or Aleve for short-term if needed for the soreness if Tylenol does not help first. Heat or ice to the affected area as needed. As above use albuterol ifyou are wheezing as this is the cause of cough, or the [employer] as I prescribed up to 3 times per day can also help cough.  Asthma, Acute Bronchospasm Acute bronchospasm caused by asthma is also referred to as an asthma attack. Bronchospasm means your air passages become narrowed. The narrowing is caused by inflammation and tightening of the muscles in the air tubes (bronchi) in your lungs. This can make it hard to breathe or cause you to wheeze and cough. CAUSES Possible triggers are:  Animal dander from the skin, hair, or feathers of animals.  Dust mites contained in house dust.  Cockroaches.  Pollen from trees or grass.  Mold.  Cigarette or tobacco smoke.  Air pollutants such as dust, household cleaners, hair sprays, aerosol sprays, paint fumes, strong chemicals, or strong odors.  Cold air or weather changes. Cold air may trigger inflammation. Winds increase molds and pollens in the air.  Strong emotions such as crying or laughing hard.  Stress.  Certain medicines such as aspirin or beta-blockers.  Sulfites in foods and drinks, such as dried fruits and wine.  Infections or inflammatory conditions, such as a flu, cold, or inflammation of the nasal  membranes (rhinitis).  Gastroesophageal reflux disease (GERD). GERD is a condition where stomach acid backs up into your esophagus.  Exercise or strenuous activity. SIGNS AND SYMPTOMS   Wheezing.  Excessive coughing, particularly at night.  Chest tightness.  Shortness of breath. DIAGNOSIS  Your health care provider will ask you about your medical history and perform a physical exam. A chest X-ray or blood testing may be performed to look for other causes of your symptoms or other conditions that may have triggered your asthma attack. TREATMENT  Treatment is aimed at reducing inflammation and opening up the airways in your lungs. Most asthma attacks are treated with inhaled medicines. These include quick relief or rescue medicines (such as bronchodilators) and controller medicines (such as inhaled corticosteroids). These medicines are sometimes given through an inhaler or a nebulizer. Systemic steroid medicine taken by mouth or given through an IV tube also can be used to reduce the inflammation when an attack is moderate or severe. Antibiotic medicines are only used if a bacterial infection is present.  HOME CARE INSTRUCTIONS   Rest.  Drink plenty of liquids. This helps the mucus to remain thin and be easily coughed up. Only use caffeine in moderation and do not use alcohol until you have recovered from your illness.  Do not smoke. Avoid being exposed to secondhand smoke.  You play a critical role in keeping yourself in good health. Avoid exposure to things that cause you to wheeze or to have breathing problems.  Keep your medicines up-to-date and available. Carefully follow your health care provider's treatment plan.  Take your medicine exactly as prescribed.  When pollen or pollution is bad, keep windows closed and use an air conditioner or go to places with air conditioning.  Asthma requires careful medical care. See your health care provider for a follow-up as advised. If you  are more than [redacted] weeks pregnant and you were prescribed any new medicines, let your obstetrician know about the visit and how you are doing. Follow up with your health care provider as directed.  After you have recovered from your asthma attack, make an appointment with your outpatient doctor to talk about ways to reduce the likelihood of future attacks. If you do not have a doctor who manages your asthma, make an appointment with a primary care doctor to discuss your asthma. SEEK IMMEDIATE MEDICAL CARE IF:   You are getting worse.  You have trouble breathing. If severe, call your local emergency services (911 in the [place]  You develop chest pain or discomfort.  You are vomiting.  You are not able to keep fluids down.  You are coughing up yellow, green, brown, or bloody sputum.  You have a fever and your symptoms suddenly get worse.  You have trouble swallowing. MAKE SURE YOU:   Understand these instructions.  Will watch your condition.  Will get help right away if you are not doing well or get worse.   This information is not intended to replace advice given to you by your health care provider. Make sure you discuss any questions you have with your health care provider.   Document Released: 01/19/2007 Document Revised: 10/09/2013 Document Reviewed: 04/11/2013 Elsevier Interactive Patient Education 2016 Elsevier Inc. Cough, Adult Coughing is a reflex that clears your throat and your airways. Coughing helps to heal and protect your lungs. It is normal to cough occasionally, but a cough that happens with other symptoms or lasts a long time may be a sign of a condition that needs treatment. A cough may last only 2-3 weeks (acute), or it may last longer than 8 weeks (chronic). CAUSES Coughing is commonly caused by:  Breathing in substances that irritate your lungs.  A viral or bacterial respiratory infection.  Allergies.  Asthma.  Postnasal drip.  Smoking.  Acid backing  up from the stomach into the esophagus (gastroesophageal reflux).  Certain medicines.  Chronic lung problems, including COPD (or rarely, lung cancer).  Other medical conditions such as heart failure. HOME CARE INSTRUCTIONS  Pay attention to any changes in your symptoms. Take these actions to help with your discomfort:  Take medicines only as told by your health care provider.  If you were prescribed an antibiotic medicine, take it as told by your health care provider. Do not stop taking the antibiotic even if you start to feel better.  Talk with your health care provider before you take a cough suppressant medicine.  Drink enough fluid to keep your urine clear or pale yellow.  If the air is dry, use a cold steam vaporizer or humidifier in your bedroom or your home to help loosen secretions.  Avoid anything that causes you to cough at work or at home.  If your cough is worse at night, try sleeping in a semi-upright position.  Avoid cigarette smoke. If you smoke, quit smoking. If you need help quitting, ask your health care provider.  Avoid caffeine.  Avoid alcohol.  Rest as needed. SEEK MEDICAL CARE IF:  You have new symptoms.  You cough up pus.  Your cough does not get better after 2-3 weeks, or your cough gets worse.  You cannot control your cough with suppressant medicines and you are losing sleep.  You develop pain that is getting worse or pain that is not controlled with pain medicines.  You have a fever.  You have unexplained weight loss.  You have night sweats. SEEK IMMEDIATE MEDICAL CARE IF:  You cough up blood.  You have difficulty breathing.  Your heartbeat is very fast.   This information is not intended to replace advice given to you by your health care provider. Make sure you discuss any questions you have with your health care provider.   Document Released: 04/02/2011 Document Revised: 06/25/2015 Document Reviewed: 12/11/2014 Elsevier  Interactive Patient Education 2016 Elsevier Inc.  Chest Wall Pain Chest wall pain is pain in or around the bones and muscles of your chest. Sometimes, an injury causes this pain. Sometimes, the cause may not be known. This pain may take several weeks or longer to get better. HOME CARE INSTRUCTIONS  Pay attention to any changes in your symptoms. Take these actions to help with your pain:   Rest as told by your health care provider.   Avoid activities that cause pain. These include any activities that use your chest muscles or your abdominal and side muscles to lift heavy items.   If directed, apply ice to the painful area:  Put ice in a plastic bag.  Place a towel between your skin and the bag.  Leave the ice on for 20 minutes, 2-3 times per day.  Take over-the-counter and prescription medicines only as told by your health care provider.  Do not use tobacco products, including cigarettes, chewing tobacco, and e-cigarettes. If you need help quitting, ask your health care provider.  Keep all follow-up visits as told by your health care provider. This is important. SEEK MEDICAL CARE IF:  You have a fever.  Your chest pain becomes worse.  You have new symptoms. SEEK IMMEDIATE MEDICAL CARE IF:  You have nausea or vomiting.  You feel sweaty or light-headed.  You have a cough with phlegm (sputum) or you cough up blood.  You develop shortness of breath.   This information is not intended to replace advice given to you by your health care provider. Make sure you discuss any questions you have with your health care provider.   Document Released: 10/04/2005 Document Revised: 06/25/2015 Document Reviewed: 12/30/2014 Elsevier Interactive Patient Education [employer].

## 2015-08-28 NOTE — Telephone Encounter (Signed)
Unable to leave message, no machine 

## 2015-08-28 NOTE — Telephone Encounter (Signed)
Please call patient. She had to leave the office prior to me finishing up and reviewing her after visit summary. Please apologize to her for this. I tried reaching her on both her cell phone and home phone multiple times and unable to do so. Instructions were listed on her after visit summary regarding her cough and treatment of her chest wall pain. I do not see anything concerning on her x-ray. I did send in some medication for her cough, and should use albuterol as discussed. If any questions, let me know and I'll be happy to call her. Thank you.

## 2015-08-29 NOTE — Telephone Encounter (Signed)
Attempted to call pateint on home number [phone] - will not forward to answering machine. Called cell [phone] - message states mailbox is full and cannot leave message.  Did leave call back number of clinic [phone] - message states call back number will be sent to pt's pager.

## 2015-08-29 NOTE — Telephone Encounter (Signed)
Called patient, no answer, unable to leave voicemail, no machine.

## 2015-09-03 NOTE — Telephone Encounter (Signed)
Called pt, unable to leave message.

## 2016-08-03 ENCOUNTER — Other Ambulatory Visit (HOSPITAL_COMMUNITY): Payer: Self-pay | Admitting: Diagnostic Radiology

## 2016-08-09 ENCOUNTER — Other Ambulatory Visit: Payer: Self-pay | Admitting: Obstetrics and Gynecology

## 2016-08-09 DIAGNOSIS — Z1231 Encounter for screening mammogram for malignant neoplasm of breast: Secondary | ICD-10-CM

## 2016-09-17 ENCOUNTER — Ambulatory Visit
Admission: RE | Admit: 2016-09-17 | Discharge: 2016-09-17 | Disposition: A | Discharge status: Home / Self Care | Payer: Federal, State, Local not specified - PPO | Source: Ambulatory Visit | Attending: Obstetrics and Gynecology | Admitting: Obstetrics and Gynecology

## 2016-09-17 DIAGNOSIS — Z1231 Encounter for screening mammogram for malignant neoplasm of breast: Secondary | ICD-10-CM

## 2016-10-24 ENCOUNTER — Ambulatory Visit (HOSPITAL_COMMUNITY)
Admission: EM | Admit: 2016-10-24 | Discharge: 2016-10-24 | Disposition: A | Discharge status: Home / Self Care | Payer: Federal, State, Local not specified - PPO | Attending: Family Medicine | Admitting: Family Medicine

## 2016-10-24 ENCOUNTER — Encounter (HOSPITAL_COMMUNITY): Payer: Self-pay | Admitting: *Deleted

## 2016-10-24 DIAGNOSIS — M545 Low back pain, unspecified: Secondary | ICD-10-CM

## 2016-10-24 DIAGNOSIS — J209 Acute bronchitis, unspecified: Secondary | ICD-10-CM

## 2016-10-24 MED ORDER — AZITHROMYCIN 250 MG PO TABS: 250.0000 mg | ORAL_TABLET | Freq: Every day | ORAL | 0 refills | Status: DC

## 2016-10-24 MED ORDER — NAPROXEN 500 MG PO TABS: 500.0000 mg | ORAL_TABLET | Freq: Two times a day (BID) | ORAL | 0 refills | Status: DC

## 2016-10-24 MED ORDER — BENZONATATE 100 MG PO CAPS: 200.0000 mg | ORAL_CAPSULE | Freq: Three times a day (TID) | ORAL | 0 refills | Status: DC | PRN

## 2016-10-24 NOTE — ED Provider Notes (Signed)
CSN: [id number]     Arrival date & time 10/24/16  1942 History   First MD Initiated Contact with Patient 10/24/16 1957     Chief Complaint  Patient presents with  . Cough   (Consider location/radiation/quality/duration/timing/severity/associated sxs/prior Treatment) Patient c/o cough and lower back pain.  The cough has been present for 3 weeks.   The history is provided by the patient.  Cough  Cough characteristics:  Productive Sputum characteristics:  White Severity:  Moderate Onset quality:  Sudden Duration:  3 weeks Timing:  Constant Chronicity:  New Smoker: no   Relieved by:  Nothing Ineffective treatments:  None tried   Past Medical History:  Diagnosis Date  . Abnormal Pap smear 06/1989  . Anal itching 05/2009  . ASCUS (atypical squamous cells of undetermined significance) on Pap smear 2006  . Asthma   . 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 Chest pressure   . H/O pelvic mass 2003  . History of irregular menstrual bleeding 02/2011  . History of measles, mumps, or rubella   . Ovarian cyst 01/2002  . Pelvic pain 12/2005  . Reflux   . Strain of shoulder, left 04/2006  . Urge incontinence 2001  . Varicella    Past Surgical History:  Procedure Laterality Date  . ACNE CYST REMOVAL     Under eye  . CESAREAN SECTION     Family History  Problem Relation Age of Onset  . Emphysema Sister   . Stroke Maternal Grandmother   . Heart disease Maternal Grandmother   . Diabetes Father   . Diabetes    . Heart disease Maternal Grandfather   . Asthma Maternal Grandfather    Social History  Substance Use Topics  . Smoking status: Never Smoker  . Smokeless tobacco: Never Used  . Alcohol use No   OB History    Gravida Para Term Preterm AB Living   4 3       3    SAB TAB Ectopic Multiple Live Births                 Review of Systems  Constitutional: Negative.   HENT: Negative.   Eyes: Negative.   Respiratory: Positive for cough.   Cardiovascular: Negative.    Gastrointestinal: Negative.   Endocrine: Negative.   Genitourinary: Negative.   Musculoskeletal: Negative.   Allergic/Immunologic: Negative.   Neurological: Negative.   Hematological: Negative.   Psychiatric/Behavioral: Negative.     Allergies  Bee venom and Peanuts [peanut oil]  Home Medications   Prior to Admission medications   Medication Sig Start Date End Date Taking? Authorizing Provider  albuterol (PROVENTIL HFA;VENTOLIN HFA) 108 (90 BASE) MCG/ACT inhaler Inhale 2 puffs into the lungs every 6 (six) hours as needed. For shortness of breath   Yes Historical Provider, MD  Vitamin D, Ergocalciferol, (DRISDOL) 50000 UNITS CAPS capsule Take 50,000 Units by mouth every 30 (thirty) days. 04/05/13  Yes Historical Provider, MD  azithromycin (ZITHROMAX) 250 MG tablet Take 1 tablet (250 mg total) by mouth daily. Take first 2 tablets together, then 1 every day until finished. 10/24/16   Lysbeth Penner, FNP  benzonatate (TESSALON) 100 MG capsule Take 1 capsule (100 mg total) by mouth 3 (three) times daily as needed for cough. 08/28/15   Wendie Agreste, MD  benzonatate (TESSALON) 100 MG capsule Take 2 capsules (200 mg total) by mouth 3 (three) times daily as needed for cough. 10/24/16   Lysbeth Penner, FNP  EPINEPHrine (EPIPEN 2-PAK) 0.3  mg/0.3 mL SOAJ injection 0.3 mg. 04/05/13   Historical Provider, MD  montelukast (SINGULAIR) 10 MG tablet Take 10 mg by mouth daily as needed (allergies).  04/05/13 05/21/15  Historical Provider, MD  naproxen (NAPROSYN) 500 MG tablet Take 1 tablet (500 mg total) by mouth 2 (two) times daily. 10/24/16   Lysbeth Penner, FNP   Meds Ordered and Administered this Visit  Medications - No data to display  BP 131/66   Pulse 77   Temp 98.1 F (36.7 C) (Oral)   Resp 16   LMP 10/06/2016 (Exact Date)   SpO2 100%  No data found.   Physical Exam  Constitutional: She appears well-developed and well-nourished.  HENT:  Head: Normocephalic and atraumatic.  Right Ear:  External ear normal.  Left Ear: External ear normal.  Mouth/Throat: Oropharynx is clear and moist.  Eyes: Conjunctivae and EOM are normal. Pupils are equal, round, and reactive to light.  Neck: Normal range of motion. Neck supple.  Cardiovascular: Normal rate, regular rhythm and normal heart sounds.   Pulmonary/Chest: Effort normal and breath sounds normal.  Abdominal: Soft. Bowel sounds are normal.  Musculoskeletal: She exhibits tenderness.  Nursing note and vitals reviewed.   Urgent Care Course   Clinical Course     Procedures (including critical care time)  Labs Review Labs Reviewed - No data to display  Imaging Review No results found.   Visual Acuity Review  Right Eye Distance:   Left Eye Distance:   Bilateral Distance:    Right Eye Near:   Left Eye Near:    Bilateral Near:         MDM   1. Acute bronchitis, unspecified organism   2. Acute midline low back pain without sciatica    Zpak as directed  Tessalon perles Naprosyn 500mg  one po bid x 7 days #14  Push po fluids, rest, tylenol and motrin otc prn as directed for fever, arthralgias, and myalgias.  Follow up prn if sx's continue or persist.    Lysbeth Penner, FNP 10/24/16 2011

## 2016-10-24 NOTE — ED Triage Notes (Signed)
C/O slightly productive cough x 3 wks; states improving some, but remains nagging.  Denies any fevers.  Has been using albuterol HFA with some relief, as well as cough drops.  Also c/o left upper back pain.

## 2016-11-17 ENCOUNTER — Encounter (HOSPITAL_COMMUNITY): Payer: Self-pay | Admitting: Emergency Medicine

## 2016-11-17 ENCOUNTER — Ambulatory Visit (HOSPITAL_COMMUNITY)
Admission: EM | Admit: 2016-11-17 | Discharge: 2016-11-17 | Disposition: A | Discharge status: Home / Self Care | Payer: Federal, State, Local not specified - PPO | Attending: Family Medicine | Admitting: Family Medicine

## 2016-11-17 DIAGNOSIS — B9789 Other viral agents as the cause of diseases classified elsewhere: Secondary | ICD-10-CM | POA: Diagnosis not present

## 2016-11-17 DIAGNOSIS — J069 Acute upper respiratory infection, unspecified: Secondary | ICD-10-CM | POA: Diagnosis not present

## 2016-11-17 MED ORDER — AZITHROMYCIN 250 MG PO TABS: ORAL_TABLET | ORAL | 0 refills | Status: DC

## 2016-11-17 MED ORDER — GUAIFENESIN-CODEINE 100-10 MG/5ML PO SYRP: 10.0000 mL | ORAL_SOLUTION | Freq: Four times a day (QID) | ORAL | 0 refills | Status: DC | PRN

## 2016-11-17 NOTE — ED Triage Notes (Signed)
The patient presented to the [facility] with a complaint of a cough and sore back x 1 week.

## 2016-11-17 NOTE — ED Provider Notes (Signed)
[place]    CSN: [id number] Arrival date & time: 11/17/16  1712     History   Chief Complaint Chief Complaint  Patient presents with  . Cough    HPI Taylor Reynolds is a 56 y.o. female.   The history is provided by the patient.  Cough  Cough characteristics:  Productive Sputum characteristics:  Yellow Severity:  Moderate Onset quality:  Gradual Duration:  1 week Progression:  Unchanged Chronicity:  New Smoker: no   Context: upper respiratory infection and weather changes     Past Medical History:  Diagnosis Date  . Abnormal Pap smear 06/1989  . Anal itching 05/2009  . ASCUS (atypical squamous cells of undetermined significance) on Pap smear 2006  . Asthma   . 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 Chest pressure   . H/O pelvic mass 2003  . History of irregular menstrual bleeding 02/2011  . History of measles, mumps, or rubella   . Ovarian cyst 01/2002  . Pelvic pain 12/2005  . Reflux   . Strain of shoulder, left 04/2006  . Urge incontinence 2001  . Varicella     Patient Active Problem List   Diagnosis Date Noted  . Acid reflux 03/08/2013  . Neck pain 04/05/2012  . Vitamin D deficiency disease 01/07/2012  . Obesity (BMI 35.0-39.9 without comorbidity) 01/07/2012  . Chest pain 02/12/2011  . Asthma 02/12/2011    Past Surgical History:  Procedure Laterality Date  . ACNE CYST REMOVAL     Under eye  . CESAREAN SECTION      OB History    Gravida Para Term Preterm AB Living   4 3       3    SAB TAB Ectopic Multiple Live Births                   Home Medications    Prior to Admission medications   Medication Sig Start Date End Date Taking? Authorizing Provider  azithromycin (ZITHROMAX Z-PAK) 250 MG tablet Take as directed on pack 11/17/16   Billy Fischer, MD  guaiFENesin-codeine [facility]) 100-10 MG/5ML syrup Take 10 mLs by mouth 4 (four) times daily as needed for cough. 11/17/16   Billy Fischer, MD  montelukast (SINGULAIR) 10 MG tablet  Take 10 mg by mouth daily as needed (allergies).  04/05/13 05/21/15  Historical Provider, MD    Family History Family History  Problem Relation Age of Onset  . Emphysema Sister   . Stroke Maternal Grandmother   . Heart disease Maternal Grandmother   . Diabetes Father   . Diabetes    . Heart disease Maternal Grandfather   . Asthma Maternal Grandfather     Social History Social History  Substance Use Topics  . Smoking status: Never Smoker  . Smokeless tobacco: Never Used  . Alcohol use No     Allergies   Bee venom and Peanuts [peanut oil]   Review of Systems Review of Systems  Constitutional: Negative.   HENT: Negative.   Respiratory: Positive for cough.   Cardiovascular: Negative.   Gastrointestinal: Negative.   All other systems reviewed and are negative.    Physical Exam Triage Vital Signs ED Triage Vitals [11/17/16 1804]  Enc Vitals Group     BP 118/81     Pulse Rate 91     Resp 20     Temp 99.7 F (37.6 C)     Temp Source Oral     SpO2 97 %  Weight      Height      Head Circumference      Peak Flow      Pain Score 2     Pain Loc      Pain Edu?      Excl. in [place]?    No data found.   Updated Vital Signs BP 118/81 (BP Location: Right Arm)   Pulse 91   Temp 99.7 F (37.6 C) (Oral)   Resp 20   SpO2 97%   Visual Acuity Right Eye Distance:   Left Eye Distance:   Bilateral Distance:    Right Eye Near:   Left Eye Near:    Bilateral Near:     Physical Exam  Constitutional: She is oriented to person, place, and time. She appears well-developed and well-nourished.  HENT:  Right Ear: External ear normal.  Left Ear: External ear normal.  Mouth/Throat: Oropharynx is clear and moist.  Eyes: Conjunctivae are normal. Pupils are equal, round, and reactive to light.  Neck: Normal range of motion. Neck supple.  Cardiovascular: Normal rate, regular rhythm, normal heart sounds and intact distal pulses.   Pulmonary/Chest: Effort normal and breath  sounds normal.  Abdominal: Soft. Bowel sounds are normal.  Lymphadenopathy:    She has no cervical adenopathy.  Neurological: She is alert and oriented to person, place, and time.  Skin: Skin is warm and dry.  Nursing note and vitals reviewed.    UC Treatments / Results  Labs (all labs ordered are listed, but only abnormal results are displayed) Labs Reviewed - No data to display  EKG  EKG Interpretation None       Radiology No results found.  Procedures Procedures (including critical care time)  Medications Ordered in UC Medications - No data to display   Initial Impression / Assessment and Plan / UC Course  I have reviewed the triage vital signs and the nursing notes.  Pertinent labs & imaging results that were available during my care of the patient were reviewed by me and considered in my medical decision making (see chart for details).       Final Clinical Impressions(s) / UC Diagnoses   Final diagnoses:  Viral URI with cough    New Prescriptions New Prescriptions   AZITHROMYCIN (ZITHROMAX Z-PAK) 250 MG TABLET    Take as directed on pack   GUAIFENESIN-CODEINE (ROBITUSSIN AC) 100-10 MG/5ML SYRUP    Take 10 mLs by mouth 4 (four) times daily as needed for cough.     Billy Fischer, MD 11/17/16 [phone]

## 2016-11-17 NOTE — Discharge Instructions (Signed)
Drink plenty of fluids as discussed, use medicine as prescribed, and mucinex or delsym for cough. Return or see your doctor if further problems °

## 2017-09-12 ENCOUNTER — Other Ambulatory Visit: Payer: Self-pay | Admitting: Obstetrics and Gynecology

## 2017-09-12 DIAGNOSIS — Z1231 Encounter for screening mammogram for malignant neoplasm of breast: Secondary | ICD-10-CM

## 2017-10-10 ENCOUNTER — Ambulatory Visit
Admission: RE | Admit: 2017-10-10 | Discharge: 2017-10-10 | Disposition: A | Discharge status: Home / Self Care | Payer: Federal, State, Local not specified - PPO | Source: Ambulatory Visit | Attending: Obstetrics and Gynecology | Admitting: Obstetrics and Gynecology

## 2017-10-10 DIAGNOSIS — Z1231 Encounter for screening mammogram for malignant neoplasm of breast: Secondary | ICD-10-CM

## 2017-12-01 ENCOUNTER — Ambulatory Visit (HOSPITAL_COMMUNITY)
Admission: EM | Admit: 2017-12-01 | Discharge: 2017-12-01 | Disposition: A | Discharge status: Home / Self Care | Payer: Federal, State, Local not specified - PPO | Attending: Internal Medicine | Admitting: Internal Medicine

## 2017-12-01 ENCOUNTER — Other Ambulatory Visit: Payer: Self-pay

## 2017-12-01 ENCOUNTER — Encounter (HOSPITAL_COMMUNITY): Payer: Self-pay | Admitting: Emergency Medicine

## 2017-12-01 DIAGNOSIS — M25561 Pain in right knee: Secondary | ICD-10-CM

## 2017-12-01 DIAGNOSIS — G8929 Other chronic pain: Secondary | ICD-10-CM

## 2017-12-01 MED ORDER — NAPROXEN 500 MG PO TABS: 500.0000 mg | ORAL_TABLET | Freq: Two times a day (BID) | ORAL | 0 refills | Status: AC

## 2017-12-01 NOTE — Discharge Instructions (Signed)
Use anti-inflammatories for pain/swelling. You may take up to 800 mg Ibuprofen every 8 hours with food. You may supplement Ibuprofen with Tylenol [phone] mg every 8 hours.  OR  Naprosyn 500 mg every 12 hours  Please apply ice as needed.  Use Ace wrap for compression.  Please follow-up with orthopedics if pain persisting.

## 2017-12-01 NOTE — ED Triage Notes (Signed)
Right knee pain, swelling that started Sunday.  No known injury.  Patient stands a lot while working

## 2017-12-02 NOTE — ED Provider Notes (Signed)
[place]    CSN: [id number] Arrival date & time: 12/01/17  [facility]     History   Chief Complaint Chief Complaint  Patient presents with  . Knee Pain    HPI AIMA MCWHIRT is a 57 y.o. female noncontributing past medical history presenting today with right knee pain.  She states that over the past week she has had increasing pain and swelling to her right knee.  She states that 2-3 years ago she had similar pain in her knee which she saw orthopedics for.  X-rays were negative at that time, advised for her to lose weight with exercise.  She is applied ice and taken some ibuprofen and feels as this is helped the swelling.  Pain worsens with walking and weightbearing.  Denies injury.  Patient works at the [employer] and is on her feet all day.  HPI  Past Medical History:  Diagnosis Date  . Abnormal Pap smear 06/1989  . Anal itching 05/2009  . ASCUS (atypical squamous cells of undetermined significance) on Pap smear 2006  . Asthma   . 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 Chest pressure   . H/O pelvic mass 2003  . History of irregular menstrual bleeding 02/2011  . History of measles, mumps, or rubella   . Ovarian cyst 01/2002  . Pelvic pain 12/2005  . Reflux   . Strain of shoulder, left 04/2006  . Urge incontinence 2001  . Varicella     Patient Active Problem List   Diagnosis Date Noted  . Acid reflux 03/08/2013  . Neck pain 04/05/2012  . Vitamin D deficiency disease 01/07/2012  . Obesity (BMI 35.0-39.9 without comorbidity) 01/07/2012  . Chest pain 02/12/2011  . Asthma 02/12/2011    Past Surgical History:  Procedure Laterality Date  . ACNE CYST REMOVAL     Under eye  . CESAREAN SECTION      OB History    Gravida Para Term Preterm AB Living   4 3       3    SAB TAB Ectopic Multiple Live Births                   Home Medications    Prior to Admission medications   Medication Sig Start Date End Date Taking? Authorizing Provider  ibuprofen  (ADVIL,MOTRIN) 800 MG tablet Take 800 mg by mouth every 8 (eight) hours as needed.   Yes [provider]  azithromycin (ZITHROMAX Z-PAK) 250 MG tablet Take as directed on pack 11/17/16   Kindl, Nelda Severe, MD  guaiFENesin-codeine [facility]) 100-10 MG/5ML syrup Take 10 mLs by mouth 4 (four) times daily as needed for cough. 11/17/16   Billy Fischer, MD  montelukast (SINGULAIR) 10 MG tablet Take 10 mg by mouth daily as needed (allergies).  04/05/13 05/21/15  [provider]  naproxen (NAPROSYN) 500 MG tablet Take 1 tablet (500 mg total) by mouth 2 (two) times daily for 10 days. 12/01/17 12/11/17  Julieta Rogalski, Elesa Hacker, PA-C    Family History Family History  Problem Relation Age of Onset  . Emphysema Sister   . Stroke Maternal Grandmother   . Heart disease Maternal Grandmother   . Diabetes Father   . Diabetes Unknown   . Heart disease Maternal Grandfather   . Asthma Maternal Grandfather   . Breast cancer Neg Hx     Social History Social History   Tobacco Use  . Smoking status: Never Smoker  . Smokeless tobacco: Never Used  Substance Use Topics  . Alcohol use: No  . Drug use: No     Allergies   Bee venom and Peanuts [peanut oil]   Review of Systems Review of Systems  Constitutional: Negative for chills and fever.  Respiratory: Negative for shortness of breath.   Cardiovascular: Negative for chest pain.  Gastrointestinal: Negative for nausea and vomiting.  Musculoskeletal: Positive for arthralgias, gait problem and joint swelling. Negative for back pain.  Skin: Negative for color change and rash.  Neurological: Negative for weakness and headaches.  All other systems reviewed and are negative.    Physical Exam Triage Vital Signs ED Triage Vitals [12/01/17 1940]  Enc Vitals Group     BP 131/66     Pulse Rate 63     Resp 20     Temp 98.1 F (36.7 C)     Temp Source Oral     SpO2 100 %     Weight      Height      Head Circumference      Peak Flow       Pain Score      Pain Loc      Pain Edu?      Excl. in [place]?    No data found.  Updated Vital Signs BP 131/66 (BP Location: Right Arm) Comment (BP Location): large cuff  Pulse 63   Temp 98.1 F (36.7 C) (Oral)   Resp 20   LMP 10/06/2016 (Exact Date)   SpO2 100%   Visual Acuity Right Eye Distance:   Left Eye Distance:   Bilateral Distance:    Right Eye Near:   Left Eye Near:    Bilateral Near:     Physical Exam  Constitutional: She appears well-developed and well-nourished. No distress.  Obese  HENT:  Head: Normocephalic and atraumatic.  Eyes: Conjunctivae are normal.  Neck: Neck supple.  Cardiovascular: Normal rate.  Pulmonary/Chest: Effort normal. No respiratory distress.  Musculoskeletal: She exhibits no edema or deformity.  Right knee: Knee does not appear significantly more swollen compared to left, no deformity.  Mild tenderness to palpation lateral to patella, lateral joint line.  No tenderness posteriorly or medially.  Patient has full active range of motion of the knee although she feels a tightness.  Patient ambulatory with mildly antalgic gait.  Neurological: She is alert.  Skin: Skin is warm and dry.  Psychiatric: She has a normal mood and affect.  Nursing note and vitals reviewed.    UC Treatments / Results  Labs (all labs ordered are listed, but only abnormal results are displayed) Labs Reviewed - No data to display  EKG  EKG Interpretation None       Radiology No results found.  Procedures Procedures (including critical care time)  Medications Ordered in UC Medications - No data to display   Initial Impression / Assessment and Plan / UC Course  I have reviewed the triage vital signs and the nursing notes.  Pertinent labs & imaging results that were available during my care of the patient were reviewed by me and considered in my medical decision making (see chart for details).     Pain likely related to overuse versus arthritis.   Patient lacking injury, full range of motion, will defer knee x-ray.  Advised patient to try either Tylenol and ibuprofen combo together with ice and heat.  Or to try Naprosyn twice daily.  Will apply Ace wrap to help with compression and swelling.  Rest as needed.  Follow-up with Bobette Mo or PCP if symptoms persisting.   Joint does not appear septic, is no erythema, full range of motion without any significant increase in pain.  Final Clinical Impressions(s) / UC Diagnoses   Final diagnoses:  Chronic pain of right knee    ED Discharge Orders        Ordered    naproxen (NAPROSYN) 500 MG tablet  2 times daily     12/01/17 2046       Controlled Substance Prescriptions [place] Controlled Substance Registry consulted? Not Applicable   Janith Lima, [place] 12/02/17 1119

## 2018-01-05 ENCOUNTER — Ambulatory Visit: Payer: Federal, State, Local not specified - PPO | Admitting: Podiatry

## 2018-01-05 ENCOUNTER — Encounter: Payer: Self-pay | Admitting: Podiatry

## 2018-01-05 VITALS — BP 125/74 | HR 67 | Resp 18

## 2018-01-05 DIAGNOSIS — B351 Tinea unguium: Secondary | ICD-10-CM

## 2018-01-05 DIAGNOSIS — B353 Tinea pedis: Secondary | ICD-10-CM | POA: Diagnosis not present

## 2018-01-05 DIAGNOSIS — Z79899 Other long term (current) drug therapy: Secondary | ICD-10-CM

## 2018-01-05 LAB — CBC WITH DIFFERENTIAL/PLATELET
BASOS PCT: 0.7 %
Basophils Absolute: 28 cells/uL (ref 0–200)
Eosinophils Absolute: 220 cells/uL (ref 15–500)
Eosinophils Relative: 5.5 %
HCT: 35.9 % (ref 35.0–45.0)
Hemoglobin: 12.1 g/dL (ref 11.7–15.5)
Lymphs Abs: 1944 cells/uL (ref 850–3900)
MCH: 28.6 pg (ref 27.0–33.0)
MCHC: 33.7 g/dL (ref 32.0–36.0)
MCV: 84.9 fL (ref 80.0–100.0)
MONOS PCT: 11.7 %
MPV: 12.7 fL — AB (ref 7.5–12.5)
NEUTROS PCT: 33.5 %
Neutro Abs: 1340 cells/uL — ABNORMAL LOW (ref 1500–7800)
PLATELETS: 206 10*3/uL (ref 140–400)
RBC: 4.23 10*6/uL (ref 3.80–5.10)
RDW: 13.2 % (ref 11.0–15.0)
TOTAL LYMPHOCYTE: 48.6 %
WBC mixed population: 468 cells/uL (ref 200–950)
WBC: 4 10*3/uL (ref 3.8–10.8)

## 2018-01-05 LAB — HEPATIC FUNCTION PANEL
AG Ratio: 1.2 (calc) (ref 1.0–2.5)
ALT: 17 U/L (ref 6–29)
AST: 15 U/L (ref 10–35)
Albumin: 3.6 g/dL (ref 3.6–5.1)
Alkaline phosphatase (APISO): 52 U/L (ref 33–130)
BILIRUBIN INDIRECT: 0.2 mg/dL (ref 0.2–1.2)
Bilirubin, Direct: 0.1 mg/dL (ref 0.0–0.2)
GLOBULIN: 3 g/dL (ref 1.9–3.7)
Total Bilirubin: 0.3 mg/dL (ref 0.2–1.2)
Total Protein: 6.6 g/dL (ref 6.1–8.1)

## 2018-01-05 MED ORDER — TERBINAFINE HCL 250 MG PO TABS: 250.0000 mg | ORAL_TABLET | Freq: Every day | ORAL | 0 refills | Status: DC

## 2018-01-05 NOTE — Patient Instructions (Signed)

## 2018-01-05 NOTE — Progress Notes (Signed)
Subjective:    Patient ID: Taylor Reynolds, female    DOB: 1961/02/08, 57 y.o.   MRN: [id number]  HPI 56 year old female presents the office today for concerns of athlete's foot as well as thick, discolored toenails most of the left big toe.  She states that she cannot cut the nails herself as they are thick and elongated they do cause pressure and pain inside shoes.  She denies any redness or drainage from the toenail sites.  She states that in general her feet are dry peeling.  She has no other concerns today.   Review of Systems  All other systems reviewed and are negative.  Past Medical History:  Diagnosis Date  . Abnormal Pap smear 06/1989  . Anal itching 05/2009  . ASCUS (atypical squamous cells of undetermined significance) on Pap smear 2006  . Asthma   . 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 Chest pressure   . H/O pelvic mass 2003  . History of irregular menstrual bleeding 02/2011  . History of measles, mumps, or rubella   . Ovarian cyst 01/2002  . Pelvic pain 12/2005  . Reflux   . Strain of shoulder, left 04/2006  . Urge incontinence 2001  . Varicella     Past Surgical History:  Procedure Laterality Date  . ACNE CYST REMOVAL     Under eye  . CESAREAN SECTION       Current Outpatient Medications:  .  azithromycin (ZITHROMAX Z-PAK) 250 MG tablet, Take as directed on pack, Disp: 6 tablet, Rfl: 0 .  guaiFENesin-codeine (ROBITUSSIN AC) 100-10 MG/5ML syrup, Take 10 mLs by mouth 4 (four) times daily as needed for cough., Disp: 180 mL, Rfl: 0 .  ibuprofen (ADVIL,MOTRIN) 800 MG tablet, Take 800 mg by mouth every 8 (eight) hours as needed., Disp: , Rfl:  .  montelukast (SINGULAIR) 10 MG tablet, Take 10 mg by mouth daily as needed (allergies). , Disp: , Rfl:  .  terbinafine (LAMISIL) 250 MG tablet, Take 1 tablet (250 mg total) by mouth daily., Disp: 90 tablet, Rfl: 0  Allergies  Allergen Reactions  . Bee Venom Anaphylaxis  . Peanuts [Peanut Oil] Anaphylaxis    Social History     Socioeconomic History  . Marital status: Divorced    Spouse name: Not on file  . Number of children: Not on file  . Years of education: Not on file  . Highest education level: Not on file  Occupational History  . Not on file  Social Needs  . Financial resource strain: Not on file  . Food insecurity:    Worry: Not on file    Inability: Not on file  . Transportation needs:    Medical: Not on file    Non-medical: Not on file  Tobacco Use  . Smoking status: Never Smoker  . Smokeless tobacco: Never Used  Substance and Sexual Activity  . Alcohol use: No  . Drug use: No  . Sexual activity: Not Currently  Lifestyle  . Physical activity:    Days per week: Not on file    Minutes per session: Not on file  . Stress: Not on file  Relationships  . Social connections:    Talks on phone: Not on file    Gets together: Not on file    Attends religious service: Not on file    Active member of club or organization: Not on file    Attends meetings of clubs or organizations: Not on file    Relationship  status: Not on file  . Intimate partner violence:    Fear of current or ex partner: Not on file    Emotionally abused: Not on file    Physically abused: Not on file    Forced sexual activity: Not on file  Other Topics Concern  . Not on file  Social History Narrative  . Not on file        Objective:   Physical Exam  General: AAO x3, NAD  Dermatological: Nails are hypertrophic, dystrophic, brittle, discolored, elongated 10.  The left hallux toenails most significantly hypertrophic and dystrophic.  No surrounding redness or drainage. Tenderness nails 1-5 bilaterally. No open lesions or pre-ulcerative lesions are identified today.  Dry, peeling skin present bilaterally.  No skin fissures or open sores.  Vascular: Dorsalis Pedis artery and Posterior Tibial artery pedal pulses are 2/4 bilateral with immedate capillary fill time. There is no pain with calf compression, swelling, warmth,  erythema.   Neruologic: Grossly intact via light touch bilateral.  Protective threshold with Semmes Wienstein monofilament intact to all pedal sites bilateral.   Musculoskeletal: No gross boney pedal deformities bilateral. No pain, crepitus, or limitation noted with foot and ankle range of motion bilateral. Muscular strength 5/5 in all groups tested bilateral.  Gait: Unassisted, Nonantalgic.      Assessment & Plan:  57 year old female symptomatic onychomycosis, tinea pedis -Treatment options discussed including all alternatives, risks, and complications -Etiology of symptoms were discussed -Sharply debrided the nails x10 without any complications or bleeding.  We discussed treatment options for onychomycosis.  After discussion regards to options and elected to proceed with Lamisil.  90 days of Lamisil was prescribed but will check a LFT and CBC prior to starting the medication she understands this.  We discussed side effects of medication and to call the office should any occur. -Follow-up in 4-6 weeks or sooner if needed.  Call any questions or concerns.  Trula Slade DPM

## 2018-01-06 ENCOUNTER — Telehealth: Payer: Self-pay | Admitting: *Deleted

## 2018-01-06 NOTE — Telephone Encounter (Signed)
Unable to leave a message the mailbox is full and unable to accept messages. 

## 2018-01-06 NOTE — Telephone Encounter (Signed)
-----   Message from Trula Slade, DPM sent at 01/06/2018 11:51 AM EDT ----- The neurophils are low (although they were low 6 years ago as well). Lamisil by mouth can affect this and make it lower. I would NOT do oral lamisil. Please let her know not to take this and we will do topical through [facility] and please order ketoconazole cream as well. Please call her and let her know. Thanks.

## 2018-01-09 ENCOUNTER — Telehealth: Payer: Self-pay | Admitting: *Deleted

## 2018-01-09 ENCOUNTER — Encounter: Payer: Self-pay | Admitting: *Deleted

## 2018-01-09 MED ORDER — KETOCONAZOLE 2 % EX CREA: 1.0000 "application " | TOPICAL_CREAM | Freq: Every day | CUTANEOUS | 2 refills | Status: DC

## 2018-01-09 NOTE — Telephone Encounter (Signed)
Unable to leave a message on pt's home phone, it rang over 1 minute without answer service.

## 2018-01-09 NOTE — Telephone Encounter (Signed)
Mailed letter explaining the blood work and DR. Wagoner's orders for [facility] onychomycosis nail lacquer.

## 2018-01-09 NOTE — Telephone Encounter (Signed)
Dr. Jacqualyn Posey ordered ketoconazole cream also.

## 2018-01-09 NOTE — Telephone Encounter (Signed)
Unable to leave message on mobile phone, mailbox is full and unable to accept messages.

## 2018-01-23 ENCOUNTER — Telehealth: Payer: Self-pay | Admitting: Podiatry

## 2018-01-23 MED ORDER — NONFORMULARY OR COMPOUNDED ITEM: 2 refills | Status: DC

## 2018-01-23 NOTE — Telephone Encounter (Signed)
Unable to leave a message the mailbox was full. I had been waiting for a response from pt whether she wanted to use the [facility] onychomycosis nail lacquer. Orders to [facility].

## 2018-01-23 NOTE — Addendum Note (Signed)
Addended by: Harriett Sine D on: 01/23/2018 03:39 PM   Modules accepted: Orders

## 2018-01-23 NOTE — Telephone Encounter (Signed)
This message is for [facility]. I'm calling in reference to a order that was supposes to be called into a pharmacy in [place]. A topical polish that I hadn't heard anything from. Could you call me at your earliest convenience or will you put the order back in? The number is [phone]. Thank you.

## 2018-02-02 ENCOUNTER — Ambulatory Visit: Payer: Federal, State, Local not specified - PPO | Admitting: Podiatry

## 2018-02-15 ENCOUNTER — Encounter (HOSPITAL_COMMUNITY): Payer: Self-pay | Admitting: Emergency Medicine

## 2018-02-15 ENCOUNTER — Ambulatory Visit (HOSPITAL_COMMUNITY)
Admission: EM | Admit: 2018-02-15 | Discharge: 2018-02-15 | Disposition: A | Discharge status: Home / Self Care | Payer: Federal, State, Local not specified - PPO | Attending: Family Medicine | Admitting: Family Medicine

## 2018-02-15 DIAGNOSIS — T148XXA Other injury of unspecified body region, initial encounter: Secondary | ICD-10-CM

## 2018-02-15 MED ORDER — NONFORMULARY OR COMPOUNDED ITEM: 2 refills | Status: DC

## 2018-02-15 MED ORDER — CYCLOBENZAPRINE HCL 5 MG PO TABS: 5.0000 mg | ORAL_TABLET | Freq: Three times a day (TID) | ORAL | 0 refills | Status: DC | PRN

## 2018-02-15 MED ORDER — IBUPROFEN 800 MG PO TABS: 800.0000 mg | ORAL_TABLET | Freq: Three times a day (TID) | ORAL | 0 refills | Status: DC | PRN

## 2018-02-15 NOTE — ED Provider Notes (Addendum)
[place]    CSN: [id number] Arrival date & time: 02/15/18  3614     History   Chief Complaint Chief Complaint  Patient presents with  . Back Pain    HPI Taylor Reynolds is a 57 y.o. female.   HPI  Patient works for the [employer].  She spends a lot of time standing and sorting.  She does not do much bending or heavy lifting.  She noticed that she had some right low back pain about a week ago.  This seemed to get better and now she has left low back pain.  It is worse with standing and walking.  Is better with rest.  There is no radiation.  No numbness or weakness.  No history of any back condition.  No injury.  No trauma.  No fall She is having no fever or chills.  No recent infection.  No urinary symptoms. No bowel or bladder complaints.  No history of cancer She took some over-the-counter ibuprofen last night with mild relief.  She has not taken any medication today Past Medical History:  Diagnosis Date  . Abnormal Pap smear 06/1989  . Anal itching 05/2009  . ASCUS (atypical squamous cells of undetermined significance) on Pap smear 2006  . Asthma   . 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 Chest pressure   . H/O pelvic mass 2003  . History of irregular menstrual bleeding 02/2011  . History of measles, mumps, or rubella   . Ovarian cyst 01/2002  . Pelvic pain 12/2005  . Reflux   . Strain of shoulder, left 04/2006  . Urge incontinence 2001  . Varicella     Patient Active Problem List   Diagnosis Date Noted  . Acid reflux 03/08/2013  . Neck pain 04/05/2012  . Vitamin D deficiency disease 01/07/2012  . Obesity (BMI 35.0-39.9 without comorbidity) 01/07/2012  . Chest pain 02/12/2011  . Asthma 02/12/2011    Past Surgical History:  Procedure Laterality Date  . ACNE CYST REMOVAL     Under eye  . CESAREAN SECTION      OB History    Gravida  4   Para  3   Term      Preterm      AB      Living  3     SAB      TAB      Ectopic      Multiple        Live Births               Home Medications    Prior to Admission medications   Medication Sig Start Date End Date Taking? Authorizing Provider  cyclobenzaprine (FLEXERIL) 5 MG tablet Take 1 tablet (5 mg total) by mouth 3 (three) times daily as needed for muscle spasms. 02/15/18   Raylene Everts, MD  ibuprofen (ADVIL,MOTRIN) 800 MG tablet Take 1 tablet (800 mg total) by mouth every 8 (eight) hours as needed. 02/15/18   Raylene Everts, MD  NONFORMULARY OR COMPOUNDED ITEM [facility]:  Onychomycosis Nail Lacquer - Fluconazole 2%, Terbinafine 1%, DMSO apply to affected area daily. 02/15/18   Raylene Everts, MD    Family History Family History  Problem Relation Age of Onset  . Emphysema Sister   . Stroke Maternal Grandmother   . Heart disease Maternal Grandmother   . Diabetes Father   . Diabetes Unknown   . Heart disease Maternal Grandfather   . Asthma  Maternal Grandfather   . Breast cancer Neg Hx     Social History Social History   Tobacco Use  . Smoking status: Never Smoker  . Smokeless tobacco: Never Used  Substance Use Topics  . Alcohol use: No  . Drug use: No     Allergies   Bee venom and Peanuts [peanut oil]   Review of Systems Review of Systems  Constitutional: Negative for chills and fever.  HENT: Negative for ear pain and sore throat.   Eyes: Negative for pain and visual disturbance.  Respiratory: Negative for cough and shortness of breath.   Cardiovascular: Negative for chest pain and palpitations.  Gastrointestinal: Negative for abdominal pain and vomiting.  Genitourinary: Negative for dysuria and hematuria.  Musculoskeletal: Positive for arthralgias, back pain and gait problem.  Skin: Negative for color change and rash.  Neurological: Negative for seizures and syncope.  All other systems reviewed and are negative.    Physical Exam Triage Vital Signs ED Triage Vitals  Enc Vitals Group     BP 02/15/18 1020 138/77     Pulse  Rate 02/15/18 1019 92     Resp 02/15/18 1019 18     Temp 02/15/18 1019 98.3 F (36.8 C)     Temp src --      SpO2 --      Weight --      Height --      Head Circumference --      Peak Flow --      Pain Score --      Pain Loc --      Pain Edu? --      Excl. in [place]? --    No data found.  Updated Vital Signs BP 138/77   Pulse 92   Temp 98.3 F (36.8 C)   Resp 18   LMP 10/06/2016 (Exact Date)    Physical Exam  Constitutional: She appears well-developed and well-nourished. No distress.  HENT:  Head: Normocephalic and atraumatic.  Eyes: Conjunctivae are normal.  Neck: Neck supple.  Cardiovascular: Normal rate and regular rhythm.  No murmur heard. Pulmonary/Chest: Effort normal and breath sounds normal. No respiratory distress.  Abdominal: Soft. There is no tenderness.  Musculoskeletal: She exhibits no edema.  Lumbar spine is straight and symmetric. Full range of motion.  Mild tenderness and spasm in the left lumbar, muscles. Strength, sensation, range of motion, and reflexes are normal in both lower extremities. Straight leg raise is negative bilateral.  Tenderness over the central spine or SI joints Pre-existing arthritis of left knee   Neurological: She is alert.  Skin: Skin is warm and dry.  Psychiatric: She has a normal mood and affect.  Nursing note and vitals reviewed.    UC Treatments / Results    Initial Impression / Assessment and Plan / UC Course  I have reviewed the triage vital signs and the nursing notes.  Pertinent labs & imaging results that were available during my care of the patient were reviewed by me and considered in my medical decision making (see chart for details).     Discussed conservative management of back pain.  Ice and heat.  Modified activity.  Anti-inflammatory medicines.  Muscle relaxers.  Activity as tolerated.  Avoid bedrest.  Follow-up with orthopedics if fails to improve over the next 1 to 2 weeks.  Work note is given Final  Clinical Impressions(s) / UC Diagnoses   Final diagnoses:  Muscle strain     Discharge Instructions  Rest Ice or heat Activity as tolerated Take thei buprofen 3 x a day with food Take the muscle relaxer as needed   ED Prescriptions    Medication Sig Dispense Auth. Provider   NONFORMULARY OR COMPOUNDED [facility]:  Onychomycosis Nail Lacquer - Fluconazole 2%, Terbinafine 1%, DMSO apply to affected area daily. [place] Raylene Everts, MD   ibuprofen (ADVIL,MOTRIN) 800 MG tablet Take 1 tablet (800 mg total) by mouth every 8 (eight) hours as needed. 30 tablet Raylene Everts, MD   cyclobenzaprine (FLEXERIL) 5 MG tablet Take 1 tablet (5 mg total) by mouth 3 (three) times daily as needed for muscle spasms. 30 tablet Raylene Everts, MD     Controlled Substance Prescriptions [place] Controlled Substance Registry consulted? Not Applicable   Raylene Everts, MD 02/15/18 1058    Raylene Everts, MD 02/15/18 [phone]

## 2018-02-15 NOTE — Discharge Instructions (Signed)
Rest Ice or heat Activity as tolerated Take thei buprofen 3 x a day with food Take the muscle relaxer as needed

## 2018-02-15 NOTE — ED Triage Notes (Signed)
Pt c/o lower back pain since Sunday.

## 2018-02-21 ENCOUNTER — Ambulatory Visit: Payer: Federal, State, Local not specified - PPO | Admitting: Podiatry

## 2018-02-21 DIAGNOSIS — B351 Tinea unguium: Secondary | ICD-10-CM

## 2018-02-21 MED ORDER — KETOCONAZOLE 2 % EX CREA: 1.0000 "application " | TOPICAL_CREAM | Freq: Every day | CUTANEOUS | 2 refills | Status: DC

## 2018-02-21 NOTE — Patient Instructions (Signed)

## 2018-02-26 NOTE — Progress Notes (Signed)
Subjective: 57 year old female presents the office today originally to check Lamisil.  However given her neutropenia we did not start this medication.  Called and a topical antifungal through [facility] but she states that she has not yet received this.  She has no new concerns. Denies any systemic complaints such as fevers, chills, nausea, vomiting. No acute changes since last appointment, and no other complaints at this time.   Objective: AAO x3, NAD DP/PT pulses palpable bilaterally, CRT less than 3 seconds Nails are hypertrophic, dystrophic, discolored with ill-defined discoloration and there brittle.  Most notably the left hallux toenail with no significantly hypertrophic and dystrophic.  There is no surrounding redness or drainage from the toenail sites. No open lesions or pre-ulcerative lesions.  No pain with calf compression, swelling, warmth, erythema  Assessment: Onychomycosis  Plan: -All treatment options discussed with the patient including all alternatives, risks, complications.  -I did reorder the topical antifungal for her.  Also discussed other treatment options including laser therapy.  Also we will send the lab work to her primary care physician in regards to her neutropenia. -As a courtesy I debrided her toenails today without any complications or bleeding. -Patient encouraged to call the office with any questions, concerns, change in symptoms.   Trula Slade DPM

## 2018-02-27 NOTE — Telephone Encounter (Signed)
Entered in error

## 2018-03-01 ENCOUNTER — Encounter: Payer: Self-pay | Admitting: Podiatry

## 2018-03-01 ENCOUNTER — Telehealth: Payer: Self-pay | Admitting: *Deleted

## 2018-03-01 NOTE — Telephone Encounter (Signed)
Called and tried to leave a message for the patient but the voice mail was full and could not leave a message and I was calling the patient to let the patient know that I had sent the RX again to the [facility] on [address] and to call the [place] office at [phone] if any concerns or questions. Lattie Haw

## 2018-03-01 NOTE — Progress Notes (Signed)
Pt's most recent office visit note and lab results were faxed via Epic to pt's PCP Roe Coombs, PA-C at [facility] per Dr. Leigh [name] request for continuation of care.

## 2018-05-30 ENCOUNTER — Ambulatory Visit: Payer: Federal, State, Local not specified - PPO | Admitting: Podiatry

## 2018-05-30 ENCOUNTER — Encounter: Payer: Self-pay | Admitting: Podiatry

## 2018-05-30 DIAGNOSIS — B351 Tinea unguium: Secondary | ICD-10-CM

## 2018-05-31 NOTE — Progress Notes (Signed)
Subjective: 57 year old female presents the office today for follow-up evaluation of onychomycosis.  Not starting Lamisil because of neutropenia so we will try topical medication.  She has not been able to get this.  Because she has had no treatment she has not had any change in the nails and she currently denies any pain in the nails or redness or drainage or any swelling or any other signs of infection.  She has no new concerns today. Denies any systemic complaints such as fevers, chills, nausea, vomiting. No acute changes since last appointment, and no other complaints at this time.   Objective: AAO x3, NAD DP/PT pulses palpable bilaterally, CRT less than 3 seconds Overall the exam is unchanged.  The nails continue be hypertrophic, dystrophic with ill-defined discoloration.  There is no pain in the nails there is no surrounding redness or drainage or any signs of infection noted today.  No open lesions or pre-ulcerative lesions.  No pain with calf compression, swelling, warmth, erythema  Assessment: Onychomycosis  Plan: -All treatment options discussed with the patient including all alternatives, risks, complications.  -I did not complete the paperwork for topical antifungal through [facility] onychomycosis.  I gave her that information of the pharmacy again today.  She does not hear from them 2 days want me know and I will also follow-up on this for her.  We are going to hold oral Lamisil at this time given her neutropenia.  We can consider doing this in the future if needed. -Patient encouraged to call the office with any questions, concerns, change in symptoms.   Trula Slade DPM

## 2018-09-05 ENCOUNTER — Ambulatory Visit (INDEPENDENT_AMBULATORY_CARE_PROVIDER_SITE_OTHER): Payer: Federal, State, Local not specified - PPO

## 2018-09-05 ENCOUNTER — Ambulatory Visit: Payer: Federal, State, Local not specified - PPO | Admitting: Sports Medicine

## 2018-09-05 ENCOUNTER — Encounter: Payer: Self-pay | Admitting: Sports Medicine

## 2018-09-05 DIAGNOSIS — B353 Tinea pedis: Secondary | ICD-10-CM | POA: Diagnosis not present

## 2018-09-05 DIAGNOSIS — M7750 Other enthesopathy of unspecified foot: Secondary | ICD-10-CM

## 2018-09-05 DIAGNOSIS — M7751 Other enthesopathy of right foot: Secondary | ICD-10-CM

## 2018-09-05 DIAGNOSIS — Z79899 Other long term (current) drug therapy: Secondary | ICD-10-CM | POA: Diagnosis not present

## 2018-09-05 DIAGNOSIS — M25571 Pain in right ankle and joints of right foot: Secondary | ICD-10-CM

## 2018-09-05 DIAGNOSIS — B351 Tinea unguium: Secondary | ICD-10-CM

## 2018-09-05 MED ORDER — MELOXICAM 15 MG PO TABS: ORAL_TABLET | ORAL | 0 refills | Status: DC

## 2018-09-05 NOTE — Progress Notes (Signed)
Subjective:  Taylor Reynolds is a 57 y.o. female patient who presents to office for evaluation of Right ankle pain. Patient complains of continued pain in the ankle over the last few weeks. Patient has tried insoles with no relief in symptoms but does states that her work shoes are worn. Patient denies any other pedal complaints. Denies injury/trip/fall/sprain/any causative factors.   Patient is also her for a med check currently using topical of urea and nail lacquer to L 1st toenail. Denies any problems with this medication.   Patient Active Problem List   Diagnosis Date Noted  . Acid reflux 03/08/2013  . Neck pain 04/05/2012  . Vitamin D deficiency disease 01/07/2012  . Obesity (BMI 35.0-39.9 without comorbidity) 01/07/2012  . Chest pain 02/12/2011  . Asthma 02/12/2011    Current Outpatient Medications on File Prior to Visit  Medication Sig Dispense Refill  . albuterol (VENTOLIN HFA) 108 (90 Base) MCG/ACT inhaler Ventolin HFA 90 mcg/actuation aerosol inhaler    . Cholecalciferol (VITAMIN D) 2000 units tablet Take by mouth.    . cyclobenzaprine (FLEXERIL) 5 MG tablet Take 1 tablet (5 mg total) by mouth 3 (three) times daily as needed for muscle spasms. 30 tablet 0  . EPINEPHrine (EPIPEN 2-PAK) 0.3 mg/0.3 mL IJ SOAJ injection Inject into the muscle.    . furosemide (LASIX) 20 MG tablet Take by mouth.    Marland Kitchen ibuprofen (ADVIL,MOTRIN) 800 MG tablet Take 1 tablet (800 mg total) by mouth every 8 (eight) hours as needed. 30 tablet 0  . ketoconazole (NIZORAL) 2 % cream Apply 1 application topically daily. 60 g 2  . meloxicam (MOBIC) 15 MG tablet meloxicam 15 mg tablet    . NON FORMULARY [facility]  Onychomycosis Nail Lacquer -  Fluconazole 2%, Terbinafine 1% DMSO Apply to affected nail once daily Qty. 120 gm 3 refills    . NON FORMULARY [facility]  Onychomycosis Nail Lacquer -  Fluconazole 2%, Terbinafine 1% DMSO Apply to affected nail once daily Qty. 120 gm 3 refills     . NON FORMULARY [facility]  Urea Cream - 40% Urea Apply 1-2 grams to affected area 3-4 times daily Qty. 120 gm 3 refills    . NONFORMULARY OR COMPOUNDED ITEM [facility]:  Onychomycosis Nail Lacquer - Fluconazole 2%, Terbinafine 1%, DMSO apply to affected area daily. 120 each 2  . pantoprazole (PROTONIX) 20 MG tablet pantoprazole 20 mg tablet,delayed release    . phenazopyridine (PYRIDIUM) 100 MG tablet Take by mouth.    . potassium chloride (K-DUR,KLOR-CON) 10 MEQ tablet Take by mouth.     No current facility-administered medications on file prior to visit.     Allergies  Allergen Reactions  . Bee Venom Anaphylaxis  . Peanuts [Peanut Oil] Anaphylaxis    Objective:  General: Alert and oriented x3 in no acute distress  Dermatology: No open lesions bilateral lower extremities, no webspace macerations, no ecchymosis bilateral, all nails x 10 are elongated, thick and mycotic with the left 1st toenail most invovled.  Vascular: Dorsalis Pedis and Posterior Tibial pedal pulses palpable, Capillary Fill Time 3 seconds,(+) pedal hair growth bilateral, trace edema bilateral lower extremities, Temperature gradient within normal limits.  Neurology: Johney Maine sensation intact via light touch bilateral.  Musculoskeletal: Mild tenderness with palpation at peroneal tendon on right. Negative talar tilt, Negative tib-fib stress, No instability. No pain with calf compression bilateral. Range of motion within normal limits with mild guarding on right ankle. Strength within normal limits in all groups  bilateral.   Gait: Antalgic gait  Xrays  Right Ankle   Impression: mild medial gutter arthritis, soft tissue swelling, no other acute finidngs.   Assessment and Plan: Problem List Items Addressed This Visit    None    Visit Diagnoses    Tendinitis of ankle or foot    -  Primary   Relevant Orders   DG Ankle Complete Right (Completed)   Right ankle pain, unspecified chronicity        Long-term use of high-risk medication       Tinea pedis of both feet       Onychomycosis           -Complete examination performed -Xrays reviewed -Discussed treatement options for tendonitis on right -Patient declined injection -Rx Mobic -Recommend support stockings -Continue with [facility] topical to nail as previously ordered  -Patient to return to office as needed or sooner if condition worsens.  Landis Martins, DPM

## 2018-09-08 ENCOUNTER — Emergency Department (HOSPITAL_COMMUNITY): Payer: Federal, State, Local not specified - PPO

## 2018-09-08 ENCOUNTER — Encounter (HOSPITAL_COMMUNITY): Payer: Self-pay | Admitting: Emergency Medicine

## 2018-09-08 ENCOUNTER — Emergency Department (HOSPITAL_COMMUNITY)
Admission: EM | Admit: 2018-09-08 | Discharge: 2018-09-08 | Disposition: A | Discharge status: Home / Self Care | Payer: Federal, State, Local not specified - PPO | Attending: Emergency Medicine | Admitting: Emergency Medicine

## 2018-09-08 DIAGNOSIS — R0789 Other chest pain: Secondary | ICD-10-CM | POA: Insufficient documentation

## 2018-09-08 DIAGNOSIS — Z79899 Other long term (current) drug therapy: Secondary | ICD-10-CM | POA: Insufficient documentation

## 2018-09-08 LAB — I-STAT TROPONIN, ED: Troponin i, poc: 0 ng/mL (ref 0.00–0.08)

## 2018-09-08 LAB — I-STAT CHEM 8, ED
BUN: 10 mg/dL (ref 6–20)
CALCIUM ION: 1.22 mmol/L (ref 1.15–1.40)
CHLORIDE: 107 mmol/L (ref 98–111)
Creatinine, Ser: 0.9 mg/dL (ref 0.44–1.00)
GLUCOSE: 89 mg/dL (ref 70–99)
HCT: 40 % (ref 36.0–46.0)
Hemoglobin: 13.6 g/dL (ref 12.0–15.0)
Potassium: 4 mmol/L (ref 3.5–5.1)
Sodium: 141 mmol/L (ref 135–145)
TCO2: 26 mmol/L (ref 22–32)

## 2018-09-08 MED ORDER — ACETAMINOPHEN 325 MG PO TABS
650.0000 mg | ORAL_TABLET | Freq: Once | ORAL | Status: AC
  Administered 2018-09-08: 650 mg via ORAL
  Filled 2018-09-08: qty 2

## 2018-09-08 NOTE — ED Notes (Signed)
Pt returned from xray

## 2018-09-08 NOTE — ED Provider Notes (Addendum)
[facility] EMERGENCY DEPARTMENT Provider Note   CSN: [id number] Arrival date & time: 09/08/18  1058     History   Chief Complaint Chief Complaint  Patient presents with  . Chest Pain    HPI Taylor Reynolds is a 57 y.o. female.  HPI signs of left-sided posterior shoulder pain which radiated to left anterior chest at infraclavicular area onset 3 AM today while at work at the [employer].  Felt like a muscle cramp.  Symptoms have been constant.  Worse severe for the initial 15 minutes.  Now mild.  Symptoms worse with moving her left shoulder or deep inspirations and improved with remaining  still.  Associated symptoms include a cough for the past month.  She denies any shortness of breath.  No other associated symptoms  Past Medical History:  Diagnosis Date  . Abnormal Pap smear 06/1989  . Anal itching 05/2009  . ASCUS (atypical squamous cells of undetermined significance) on Pap smear 2006  . Asthma   . 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 Chest pressure   . H/O pelvic mass 2003  . History of irregular menstrual bleeding 02/2011  . History of measles, mumps, or rubella   . Ovarian cyst 01/2002  . Pelvic pain 12/2005  . Reflux   . Strain of shoulder, left 04/2006  . Urge incontinence 2001  . Varicella     Patient Active Problem List   Diagnosis Date Noted  . Acid reflux 03/08/2013  . Neck pain 04/05/2012  . Vitamin D deficiency disease 01/07/2012  . Obesity (BMI 35.0-39.9 without comorbidity) 01/07/2012  . Chest pain 02/12/2011  . Asthma 02/12/2011    Past Surgical History:  Procedure Laterality Date  . ACNE CYST REMOVAL     Under eye  . CESAREAN SECTION       OB History    Gravida  4   Para  3   Term      Preterm      AB      Living  3     SAB      TAB      Ectopic      Multiple      Live Births               Home Medications    Prior to Admission medications   Medication Sig Start Date End Date Taking? Authorizing  Provider  albuterol (VENTOLIN HFA) 108 (90 Base) MCG/ACT inhaler Ventolin HFA 90 mcg/actuation aerosol inhaler    [provider]  Cholecalciferol (VITAMIN D) 2000 units tablet Take by mouth. 07/04/17   [provider]  cyclobenzaprine (FLEXERIL) 5 MG tablet Take 1 tablet (5 mg total) by mouth 3 (three) times daily as needed for muscle spasms. 02/15/18   Raylene Everts, MD  EPINEPHrine (EPIPEN 2-PAK) 0.3 mg/0.3 mL IJ SOAJ injection Inject into the muscle. 11/01/17   [provider]  furosemide (LASIX) 20 MG tablet Take by mouth. 12/22/17   [provider]  ibuprofen (ADVIL,MOTRIN) 800 MG tablet Take 1 tablet (800 mg total) by mouth every 8 (eight) hours as needed. 02/15/18   Raylene Everts, MD  ketoconazole (NIZORAL) 2 % cream Apply 1 application topically daily. 02/21/18   Trula Slade, DPM  meloxicam (MOBIC) 15 MG tablet meloxicam 15 mg tablet 09/05/18   Landis Martins, DPM  NON FORMULARY [facility]  Onychomycosis Nail Lacquer -  Fluconazole 2%, Terbinafine 1% DMSO Apply to affected nail once  daily Qty. 120 gm 3 refills    [provider]  NON FORMULARY [facility]  Onychomycosis Nail Lacquer -  Fluconazole 2%, Terbinafine 1% DMSO Apply to affected nail once daily Qty. 120 gm 3 refills    [provider]  NON [facility]  Urea Cream - 40% Urea Apply 1-2 grams to affected area 3-4 times daily Qty. 120 gm 3 refills    [provider]  NONFORMULARY OR COMPOUNDED [facility]:  Onychomycosis Nail Lacquer - Fluconazole 2%, Terbinafine 1%, DMSO apply to affected area daily. 02/15/18   Raylene Everts, MD  pantoprazole (PROTONIX) 20 MG tablet pantoprazole 20 mg tablet,delayed release    [provider]  phenazopyridine (PYRIDIUM) 100 MG tablet Take by mouth. 08/31/17   [provider]  potassium chloride (K-DUR,KLOR-CON) 10 MEQ tablet Take by mouth. 12/22/17   [provider]    Family History Family History  Problem Relation Age of Onset  . Emphysema Sister   . Stroke Maternal Grandmother   . Heart disease Maternal Grandmother   . Diabetes Father   . Diabetes Unknown   . Heart disease Maternal Grandfather   . Asthma Maternal Grandfather   . Breast cancer Neg Hx     Social History Social History   Tobacco Use  . Smoking status: Never Smoker  . Smokeless tobacco: Never Used  Substance Use Topics  . Alcohol use: No  . Drug use: No     Allergies   Bee venom and Peanuts [peanut oil]   Review of Systems Review of Systems  Constitutional: Negative.   HENT: Negative.   Respiratory: Positive for cough.   Cardiovascular: Positive for chest pain.  Gastrointestinal: Negative.   Musculoskeletal: Negative.   Skin: Negative.   Neurological: Negative.   Psychiatric/Behavioral: Negative.   All other systems reviewed and are negative.    Physical Exam Updated Vital Signs BP 129/63 Comment: Simultaneous filing. User may not have seen previous data.  Pulse 84 Comment: Simultaneous filing. User may not have seen previous data.  Temp 98.1 F (36.7 C) (Oral)   Resp 15 Comment: Simultaneous filing. User may not have seen previous data.  LMP 10/06/2016 (Exact Date)   SpO2 100% Comment: Simultaneous filing. User may not have seen previous data.  Physical Exam  Constitutional: She appears well-developed and well-nourished.  HENT:  Head: Normocephalic and atraumatic.  Eyes: Pupils are equal, round, and reactive to light. Conjunctivae are normal.  Neck: Neck supple. No tracheal deviation present. No thyromegaly present.  Cardiovascular: Normal rate, regular rhythm, normal heart sounds and intact distal pulses.  No murmur heard. Pulmonary/Chest: Effort normal and breath sounds normal.  Abdominal: Soft. Bowel sounds are normal. She exhibits no distension. There is no tenderness.  Musculoskeletal: Normal range of motion. She exhibits no  edema or tenderness.  Neurological: She is alert. Coordination normal.  Skin: Skin is warm and dry. No rash noted.  Psychiatric: She has a normal mood and affect.  Nursing note and vitals reviewed.    ED Treatments / Results  Labs (all labs ordered are listed, but only abnormal results are displayed) Labs Reviewed  I-STAT CHEM 8, ED  I-STAT TROPONIN, ED    EKG EKG Interpretation  Date/Time:  Friday September 08 2018 11:09:09 EST Ventricular Rate:  87 PR Interval:    QRS Duration: 86 QT Interval:  371 QTC Calculation: 447 R Axis:   19 Text Interpretation:  Sinus rhythm Minimal ST depression, inferior leads No  significant change since last tracing Confirmed by Orlie Dakin [id number]) on 09/08/2018 11:42:36 AM   Radiology No results found.  Procedures Procedures (including critical care time)  Medications Ordered in ED Medications  acetaminophen (TYLENOL) tablet 650 mg (has no administration in time range)   2:40 PM patient asymptomatic after treatment with Tylenol.   Results for orders placed or performed during the hospital encounter of 09/08/18  I-stat chem 8, ed  Result Value Ref Range   Sodium 141 135 - 145 mmol/L   Potassium 4.0 3.5 - 5.1 mmol/L   Chloride 107 98 - 111 mmol/L   BUN 10 6 - 20 mg/dL   Creatinine, Ser 0.90 0.44 - 1.00 mg/dL   Glucose, Bld 89 70 - 99 mg/dL   Calcium, Ion 1.22 1.15 - 1.40 mmol/L   TCO2 26 22 - 32 mmol/L   Hemoglobin 13.6 12.0 - 15.0 g/dL   HCT 40.0 36.0 - 46.0 %  I-stat troponin, ED  Result Value Ref Range   Troponin i, poc 0.00 0.00 - 0.08 ng/mL   Comment 3           Dg Chest 2 View  Result Date: 09/08/2018 CLINICAL DATA:  Chest pain EXAM: CHEST - 2 VIEW COMPARISON:  08/28/2015 FINDINGS: Normal heart size and mediastinal contours. No acute infiltrate or edema. No effusion or pneumothorax. No acute osseous findings. Artifact from EKG leads. IMPRESSION: Negative chest. Electronically Signed   By: Monte Fantasia M.D.   On:  09/08/2018 13:14   Dg Ankle Complete Right  Result Date: 09/05/2018 Please see detailed radiograph report in office note.  Initial Impression / Assessment and Plan / ED Course  I have reviewed the triage vital signs and the nursing notes.  Pertinent labs & imaging results that were available during my care of the patient were reviewed by me and considered in my medical decision making (see chart for details).     Strongly doubt ACS.  Highly atypical symptoms.  Heart score equals 1-2(she is given one point for family history due to disease in grandparent.) Tylenol for pain follow-up with PMD.  Lab work unremarkable.  Chest x-ray viewed by me Final Clinical Impressions(s) / ED Diagnoses  Dx atypical chest pain Final diagnoses:  None    ED Discharge Orders    None       Orlie Dakin, MD 09/08/18 [place], [place], MD 09/08/18 1446

## 2018-09-08 NOTE — ED Notes (Signed)
Patient verbalizes understanding of discharge instructions. Opportunity for questioning and answers were provided. Armband removed by staff, pt discharged from ED.  

## 2018-09-08 NOTE — ED Triage Notes (Signed)
Patient reports around 0300 this am she began experiencing back pain that radiated around to her L axilla and into her L chest, pain is worse with inspiration. She also endorses dry cough x1 month, went on 2 hour long drive to [place] in September, denies leg pain but reports bilateral lower leg swelling. Pt a/ox4 resp e/u, nad.

## 2018-09-08 NOTE — Discharge Instructions (Addendum)
It is okay to take Tylenol as directed for pain.  Call your primary care provider on Monday, 09/11/2018.  She may want to see you in the office

## 2018-09-13 ENCOUNTER — Other Ambulatory Visit: Payer: Self-pay

## 2018-09-13 MED ORDER — MELOXICAM 15 MG PO TABS: 15.0000 mg | ORAL_TABLET | Freq: Every day | ORAL | 0 refills | Status: DC

## 2018-09-27 ENCOUNTER — Encounter (HOSPITAL_COMMUNITY): Payer: Self-pay

## 2018-09-27 ENCOUNTER — Ambulatory Visit (HOSPITAL_COMMUNITY)
Admission: EM | Admit: 2018-09-27 | Discharge: 2018-09-27 | Disposition: A | Discharge status: Home / Self Care | Payer: Federal, State, Local not specified - PPO | Attending: Family Medicine | Admitting: Family Medicine

## 2018-09-27 DIAGNOSIS — L509 Urticaria, unspecified: Secondary | ICD-10-CM

## 2018-09-27 MED ORDER — HYDROXYZINE HCL 25 MG PO TABS: 25.0000 mg | ORAL_TABLET | Freq: Four times a day (QID) | ORAL | 0 refills | Status: AC

## 2018-09-27 MED ORDER — PREDNISONE 10 MG (21) PO TBPK: ORAL_TABLET | Freq: Every day | ORAL | 0 refills | Status: DC

## 2018-09-27 NOTE — ED Triage Notes (Signed)
Pt Present rash all over body from a allergic reaction to something she has ate.  Pt has tried Benadryl and hydrocortisone cream.

## 2018-09-27 NOTE — Discharge Instructions (Signed)
Prednisone as directed. Hydroxyzine for itching. Try to avoid itching/scratching. Monitor for any new exposures that could be causing symptoms. Monitor for spreading redness, increased warmth, fever, follow up for reevaluation needed.

## 2018-09-27 NOTE — ED Provider Notes (Signed)
[facility]    CSN: [id number] Arrival date & time: 09/27/18  0807     History   Chief Complaint Chief Complaint  Patient presents with  . Allergic Reaction    HPI Taylor Reynolds is a 57 y.o. female.   57 year old female comes in with few day history of hives/itching throughout the body. States it first started on the back, and then started having itching to the abdomen and arms. States itching started prior to the rash. She is allergic to peanut oil, and had some hazelnut snacks that could have contained peanut oil. No throat irritation, swelling, trouble breathing, trouble swallowing, tripoding, drooling, trismus. States apart from the new snacks, no obvious new exposures. Has tried benadryl and hydrocortisone cream with little relief.      Past Medical History:  Diagnosis Date  . Abnormal Pap smear 06/1989  . Anal itching 05/2009  . ASCUS (atypical squamous cells of undetermined significance) on Pap smear 2006  . Asthma   . 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 Chest pressure   . H/O pelvic mass 2003  . History of irregular menstrual bleeding 02/2011  . History of measles, mumps, or rubella   . Ovarian cyst 01/2002  . Pelvic pain 12/2005  . Reflux   . Strain of shoulder, left 04/2006  . Urge incontinence 2001  . Varicella     Patient Active Problem List   Diagnosis Date Noted  . Acid reflux 03/08/2013  . Neck pain 04/05/2012  . Vitamin D deficiency disease 01/07/2012  . Obesity (BMI 35.0-39.9 without comorbidity) 01/07/2012  . Chest pain 02/12/2011  . Asthma 02/12/2011    Past Surgical History:  Procedure Laterality Date  . ACNE CYST REMOVAL     Under eye  . CESAREAN SECTION      OB History    Gravida  4   Para  3   Term      Preterm      AB      Living  3     SAB      TAB      Ectopic      Multiple      Live Births               Home Medications    Prior to Admission medications   Medication Sig Start Date End Date  Taking? Authorizing Provider  albuterol (VENTOLIN HFA) 108 (90 Base) MCG/ACT inhaler Ventolin HFA 90 mcg/actuation aerosol inhaler    [provider]  Cholecalciferol (VITAMIN D) 2000 units tablet Take by mouth. 07/04/17   [provider]  cyclobenzaprine (FLEXERIL) 5 MG tablet Take 1 tablet (5 mg total) by mouth 3 (three) times daily as needed for muscle spasms. 02/15/18   Raylene Everts, MD  EPINEPHrine (EPIPEN 2-PAK) 0.3 mg/0.3 mL IJ SOAJ injection Inject into the muscle. 11/01/17   [provider]  furosemide (LASIX) 20 MG tablet Take by mouth. 12/22/17   [provider]  hydrOXYzine (ATARAX/VISTARIL) 25 MG tablet Take 1 tablet (25 mg total) by mouth every 6 (six) hours for 5 days. 09/27/18 10/02/18  Ok Edwards, PA-C  ibuprofen (ADVIL,MOTRIN) 800 MG tablet Take 1 tablet (800 mg total) by mouth every 8 (eight) hours as needed. 02/15/18   Raylene Everts, MD  ketoconazole (NIZORAL) 2 % cream Apply 1 application topically daily. 02/21/18   Trula Slade, DPM  meloxicam (MOBIC) 15 MG tablet Take 1 tablet (15 mg  total) by mouth daily. meloxicam 15 mg tablet 09/13/18   Landis Martins, DPM  NON FORMULARY [facility]  Onychomycosis Nail Lacquer -  Fluconazole 2%, Terbinafine 1% DMSO Apply to affected nail once daily Qty. 120 gm 3 refills    [provider]  NON FORMULARY [facility]  Onychomycosis Nail Lacquer -  Fluconazole 2%, Terbinafine 1% DMSO Apply to affected nail once daily Qty. 120 gm 3 refills    [provider]  NON [facility]  Urea Cream - 40% Urea Apply 1-2 grams to affected area 3-4 times daily Qty. 120 gm 3 refills    [provider]  NONFORMULARY OR COMPOUNDED [facility]:  Onychomycosis Nail Lacquer - Fluconazole 2%, Terbinafine 1%, DMSO apply to affected area daily. 02/15/18   Raylene Everts, MD  pantoprazole (PROTONIX) 20 MG tablet pantoprazole 20 mg tablet,delayed  release    [provider]  phenazopyridine (PYRIDIUM) 100 MG tablet Take by mouth. 08/31/17   [provider]  potassium chloride (K-DUR,KLOR-CON) 10 MEQ tablet Take by mouth. 12/22/17   [provider]  predniSONE (STERAPRED UNI-PAK 21 TAB) 10 MG (21) TBPK tablet Take by mouth daily. Take 6 tabs by mouth day 1, then 5 tabs, then 4 tabs, then 3 tabs, 2 tabs, then 1 tab for the last day 09/27/18   Ok Edwards, PA-C    Family History Family History  Problem Relation Age of Onset  . Emphysema Sister   . Stroke Maternal Grandmother   . Heart disease Maternal Grandmother   . Diabetes Father   . Diabetes Unknown   . Heart disease Maternal Grandfather   . Asthma Maternal Grandfather   . Breast cancer Neg Hx     Social History Social History   Tobacco Use  . Smoking status: Never Smoker  . Smokeless tobacco: Never Used  Substance Use Topics  . Alcohol use: No  . Drug use: No     Allergies   Bee venom and Peanuts [peanut oil]   Review of Systems Review of Systems  Reason unable to perform ROS: See HPI as above.     Physical Exam Triage Vital Signs ED Triage Vitals  Enc Vitals Group     BP 09/27/18 0840 125/76     Pulse Rate 09/27/18 0840 88     Resp 09/27/18 0840 16     Temp 09/27/18 0840 98.2 F (36.8 C)     Temp Source 09/27/18 0840 Oral     SpO2 09/27/18 0840 98 %     Weight --      Height --      Head Circumference --      Peak Flow --      Pain Score 09/27/18 0843 0     Pain Loc --      Pain Edu? --      Excl. in [place]? --    No data found.  Updated Vital Signs BP 125/76 (BP Location: Left Arm)   Pulse 88   Temp 98.2 F (36.8 C) (Oral)   Resp 16   LMP 10/06/2016 (Exact Date)   SpO2 98%   Physical Exam  Constitutional: She is oriented to person, place, and time. She appears well-developed and well-nourished. No distress.  HENT:  Head: Normocephalic and atraumatic.  Eyes: Pupils are equal, round, and reactive to light.  Conjunctivae are normal.  Neurological: She is alert and oriented to person, place, and time.  Skin: Skin is warm and  dry. She is not diaphoretic.  Diffuse maculopapular rash with surrounding erythema to the upper extremities, abdomen and back. Rash is sparing the breast, neck and face.     UC Treatments / Results  Labs (all labs ordered are listed, but only abnormal results are displayed) Labs Reviewed - No data to display  EKG None  Radiology No results found.  Procedures Procedures (including critical care time)  Medications Ordered in UC Medications - No data to display  Initial Impression / Assessment and Plan / UC Course  I have reviewed the triage vital signs and the nursing notes.  Pertinent labs & imaging results that were available during my care of the patient were reviewed by me and considered in my medical decision making (see chart for details).    Prednisone as directed. Hydroxyzine for itching. Other symptomatic treatment discussed. Return precautions given.  Final Clinical Impressions(s) / UC Diagnoses   Final diagnoses:  Hives    ED Prescriptions    Medication Sig Dispense Auth. Provider   predniSONE (STERAPRED UNI-PAK 21 TAB) 10 MG (21) TBPK tablet Take by mouth daily. Take 6 tabs by mouth day 1, then 5 tabs, then 4 tabs, then 3 tabs, 2 tabs, then 1 tab for the last day 21 tablet Valarie Farace V, PA-C   hydrOXYzine (ATARAX/VISTARIL) 25 MG tablet Take 1 tablet (25 mg total) by mouth every 6 (six) hours for 5 days. 20 tablet Tobin Chad, [place] 09/27/18 [phone]

## 2018-10-03 ENCOUNTER — Other Ambulatory Visit: Payer: Self-pay | Admitting: Obstetrics and Gynecology

## 2018-10-03 DIAGNOSIS — Z1231 Encounter for screening mammogram for malignant neoplasm of breast: Secondary | ICD-10-CM

## 2018-11-13 ENCOUNTER — Ambulatory Visit
Admission: RE | Admit: 2018-11-13 | Discharge: 2018-11-13 | Disposition: A | Discharge status: Home / Self Care | Payer: Federal, State, Local not specified - PPO | Source: Ambulatory Visit | Attending: Obstetrics and Gynecology | Admitting: Obstetrics and Gynecology

## 2018-11-13 DIAGNOSIS — Z1231 Encounter for screening mammogram for malignant neoplasm of breast: Secondary | ICD-10-CM

## 2019-01-02 ENCOUNTER — Ambulatory Visit: Payer: Federal, State, Local not specified - PPO | Admitting: Sports Medicine

## 2019-01-07 ENCOUNTER — Other Ambulatory Visit: Payer: Self-pay

## 2019-01-07 ENCOUNTER — Ambulatory Visit (HOSPITAL_COMMUNITY)
Admission: EM | Admit: 2019-01-07 | Discharge: 2019-01-07 | Disposition: A | Discharge status: Home / Self Care | Payer: Federal, State, Local not specified - PPO | Attending: Family Medicine | Admitting: Family Medicine

## 2019-01-07 ENCOUNTER — Encounter (HOSPITAL_COMMUNITY): Payer: Self-pay

## 2019-01-07 DIAGNOSIS — R059 Cough, unspecified: Secondary | ICD-10-CM

## 2019-01-07 DIAGNOSIS — R05 Cough: Secondary | ICD-10-CM | POA: Diagnosis not present

## 2019-01-07 MED ORDER — BENZONATATE 100 MG PO CAPS: 100.0000 mg | ORAL_CAPSULE | Freq: Three times a day (TID) | ORAL | 0 refills | Status: DC

## 2019-01-07 MED ORDER — ALBUTEROL SULFATE HFA 108 (90 BASE) MCG/ACT IN AERS: 1.0000 | INHALATION_SPRAY | Freq: Four times a day (QID) | RESPIRATORY_TRACT | 0 refills | Status: AC | PRN

## 2019-01-07 NOTE — ED Triage Notes (Signed)
Pt presents with productive cough with green mucus.  Pt has history of asthma.    Pt would also like to get an area of insect bite on hand assessed.

## 2019-01-07 NOTE — ED Provider Notes (Signed)
[place]    CSN: [id number] Arrival date & time: 01/07/19  1103     History   Chief Complaint Chief Complaint  Patient presents with  . Cough  . Insect Bite    HPI Taylor Reynolds is a 58 y.o. female.   Patient is a 58 year old female past medical history of asthma.  She presents with approximate 1 week of cough, congestion.  Her symptoms have improved.  Her cough was initially productive and now it is more dry.  She is feeling better.  She has been using over-the-counter Mucinex for cough, congestion.  The cough is worse at night.  She is having some mild intermittent wheezing.  She is here requesting an albuterol inhaler.  Denies any associated fevers, chills, night sweats, chest pain, shortness of breath.  No recent traveling or sick contacts.  ROS per HPI      Past Medical History:  Diagnosis Date  . Abnormal Pap smear 06/1989  . Anal itching 05/2009  . ASCUS (atypical squamous cells of undetermined significance) on Pap smear 2006  . Asthma   . 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 Chest pressure   . H/O pelvic mass 2003  . History of irregular menstrual bleeding 02/2011  . History of measles, mumps, or rubella   . Ovarian cyst 01/2002  . Pelvic pain 12/2005  . Reflux   . Strain of shoulder, left 04/2006  . Urge incontinence 2001  . Varicella     Patient Active Problem List   Diagnosis Date Noted  . Acid reflux 03/08/2013  . Neck pain 04/05/2012  . Vitamin D deficiency disease 01/07/2012  . Obesity (BMI 35.0-39.9 without comorbidity) 01/07/2012  . Chest pain 02/12/2011  . Asthma 02/12/2011    Past Surgical History:  Procedure Laterality Date  . ACNE CYST REMOVAL     Under eye  . CESAREAN SECTION      OB History    Gravida  4   Para  3   Term      Preterm      AB      Living  3     SAB      TAB      Ectopic      Multiple      Live Births               Home Medications    Prior to Admission medications    Medication Sig Start Date End Date Taking? Authorizing Provider  albuterol (PROVENTIL HFA;VENTOLIN HFA) 108 (90 Base) MCG/ACT inhaler Inhale 1-2 puffs into the lungs every 6 (six) hours as needed for wheezing or shortness of breath. 01/07/19   Washington Whedbee A, NP  benzonatate (TESSALON) 100 MG capsule Take 1 capsule (100 mg total) by mouth every 8 (eight) hours. 01/07/19   Loura Halt A, NP  Cholecalciferol (VITAMIN D) 2000 units tablet Take by mouth. 07/04/17   [provider]  cyclobenzaprine (FLEXERIL) 5 MG tablet Take 1 tablet (5 mg total) by mouth 3 (three) times daily as needed for muscle spasms. 02/15/18   Raylene Everts, MD  EPINEPHrine (EPIPEN 2-PAK) 0.3 mg/0.3 mL IJ SOAJ injection Inject into the muscle. 11/01/17   [provider]  furosemide (LASIX) 20 MG tablet Take by mouth. 12/22/17   [provider]  ibuprofen (ADVIL,MOTRIN) 800 MG tablet Take 1 tablet (800 mg total) by mouth every 8 (eight) hours as needed. 02/15/18   Raylene Everts, MD  ketoconazole (  NIZORAL) 2 % cream Apply 1 application topically daily. 02/21/18   Trula Slade, DPM  meloxicam (MOBIC) 15 MG tablet Take 1 tablet (15 mg total) by mouth daily. meloxicam 15 mg tablet 09/13/18   Landis Martins, DPM  NON FORMULARY [facility]  Onychomycosis Nail Lacquer -  Fluconazole 2%, Terbinafine 1% DMSO Apply to affected nail once daily Qty. 120 gm 3 refills    [provider]  NON FORMULARY [facility]  Onychomycosis Nail Lacquer -  Fluconazole 2%, Terbinafine 1% DMSO Apply to affected nail once daily Qty. 120 gm 3 refills    [provider]  NON [place]  Urea Cream - 40% Urea Apply 1-2 grams to affected area 3-4 times daily Qty. 120 gm 3 refills    [provider]  NONFORMULARY OR COMPOUNDED [place]:  Onychomycosis Nail Lacquer - Fluconazole 2%, Terbinafine 1%, DMSO apply to affected area daily. 02/15/18   Raylene Everts, MD  pantoprazole (PROTONIX) 20 MG tablet pantoprazole 20 mg tablet,delayed release    [provider]  phenazopyridine (PYRIDIUM) 100 MG tablet Take by mouth. 08/31/17   [provider]  potassium chloride (K-DUR,KLOR-CON) 10 MEQ tablet Take by mouth. 12/22/17   [provider]  predniSONE (STERAPRED UNI-PAK 21 TAB) 10 MG (21) TBPK tablet Take by mouth daily. Take 6 tabs by mouth day 1, then 5 tabs, then 4 tabs, then 3 tabs, 2 tabs, then 1 tab for the last day 09/27/18   Ok Edwards, PA-C    Family History Family History  Problem Relation Age of Onset  . Emphysema Sister   . Stroke Maternal Grandmother   . Heart disease Maternal Grandmother   . Diabetes Father   . Diabetes Other   . Heart disease Maternal Grandfather   . Asthma Maternal Grandfather   . Breast cancer Neg Hx     Social History Social History   Tobacco Use  . Smoking status: Never Smoker  . Smokeless tobacco: Never Used  Substance Use Topics  . Alcohol use: No  . Drug use: No     Allergies   Bee venom and Peanuts [peanut oil]   Review of Systems Review of Systems   Physical Exam Triage Vital Signs ED Triage Vitals  Enc Vitals Group     BP 01/07/19 1118 128/78     Pulse Rate 01/07/19 1118 70     Resp 01/07/19 1118 16     Temp 01/07/19 1118 97.9 F (36.6 C)     Temp Source 01/07/19 1118 Oral     SpO2 01/07/19 1118 97 %     Weight --      Height --      Head Circumference --      Peak Flow --      Pain Score 01/07/19 1121 1     Pain Loc --      Pain Edu? --      Excl. in [place]? --    No data found.  Updated Vital Signs BP 128/78 (BP Location: Left Arm)   Pulse 70   Temp 97.9 F (36.6 C) (Oral)   Resp 16   LMP 10/06/2016 (Exact Date)   SpO2 97%   Visual Acuity Right Eye Distance:   Left Eye Distance:   Bilateral Distance:    Right Eye Near:   Left Eye Near:    Bilateral Near:     Physical Exam Vitals signs and nursing note reviewed.  Constitutional:       General: She is not in acute distress.    Appearance: Normal appearance. She is not ill-appearing, toxic-appearing or diaphoretic.  HENT:     Head: Normocephalic and atraumatic.     Right Ear: Tympanic membrane and ear canal normal.     Left Ear: Tympanic membrane and ear canal normal.     Mouth/Throat:     Mouth: Mucous membranes are moist.     Pharynx: Oropharynx is clear.  Eyes:     Conjunctiva/sclera: Conjunctivae normal.  Neck:     Musculoskeletal: Normal range of motion.  Cardiovascular:     Rate and Rhythm: Normal rate and regular rhythm.     Pulses: Normal pulses.     Heart sounds: Normal heart sounds.  Pulmonary:     Effort: Pulmonary effort is normal.     Breath sounds: Wheezing present.     Comments: Mild inspiratory wheezing Musculoskeletal: Normal range of motion.  Skin:    General: Skin is warm and dry.  Neurological:     Mental Status: She is alert.  Psychiatric:        Mood and Affect: Mood normal.      UC Treatments / Results  Labs (all labs ordered are listed, but only abnormal results are displayed) Labs Reviewed - No data to display  EKG None  Radiology No results found.  Procedures Procedures (including critical care time)  Medications Ordered in UC Medications - No data to display  Initial Impression / Assessment and Plan / UC Course  I have reviewed the triage vital signs and the nursing notes.  Pertinent labs & imaging results that were available during my care of the patient were reviewed by me and considered in my medical decision making (see chart for details).     Cough- this has improved over the past week. She has been taking OTC meds. She is out of her inhaler and requesting this due to wheezing at night. Cough is worse at night.   Refilling albuterol inhaler and giving Tessalon Perles for cough She can continue the over-the-counter medication as needed. Follow up as needed for continued or worsening symptoms   Final  Clinical Impressions(s) / UC Diagnoses   Final diagnoses:  Cough     Discharge Instructions     I am refilling your albuterol inhaler Tessalon Perles for cough every 8 hours as needed Make sure you are staying hydrated Follow-up with your primary care provider as scheduled    ED Prescriptions    Medication Sig Dispense Auth. Provider   albuterol (PROVENTIL HFA;VENTOLIN HFA) 108 (90 Base) MCG/ACT inhaler Inhale 1-2 puffs into the lungs every 6 (six) hours as needed for wheezing or shortness of breath. 1 Inhaler Quintyn Dombek A, NP   benzonatate (TESSALON) 100 MG capsule Take 1 capsule (100 mg total) by mouth every 8 (eight) hours. 21 capsule Loura Halt A, NP     Controlled Substance Prescriptions [place] Controlled Substance Registry consulted? Not Applicable   Orvan July, NP 01/07/19 1138

## 2019-01-07 NOTE — Discharge Instructions (Signed)
I am refilling your albuterol inhaler Tessalon Perles for cough every 8 hours as needed Make sure you are staying hydrated Follow-up with your primary care provider as scheduled

## 2019-01-16 ENCOUNTER — Encounter: Payer: Self-pay | Admitting: Sports Medicine

## 2019-01-16 ENCOUNTER — Other Ambulatory Visit: Payer: Self-pay

## 2019-01-16 ENCOUNTER — Ambulatory Visit: Payer: Federal, State, Local not specified - PPO | Admitting: Sports Medicine

## 2019-01-16 VITALS — Temp 98.1°F

## 2019-01-16 DIAGNOSIS — Z79899 Other long term (current) drug therapy: Secondary | ICD-10-CM

## 2019-01-16 DIAGNOSIS — M79674 Pain in right toe(s): Secondary | ICD-10-CM

## 2019-01-16 DIAGNOSIS — B353 Tinea pedis: Secondary | ICD-10-CM | POA: Diagnosis not present

## 2019-01-16 DIAGNOSIS — B351 Tinea unguium: Secondary | ICD-10-CM | POA: Diagnosis not present

## 2019-01-16 DIAGNOSIS — M79675 Pain in left toe(s): Secondary | ICD-10-CM

## 2019-01-16 NOTE — Progress Notes (Signed)
Subjective:  Taylor Reynolds is a 58 y.o. female patient who presents to office for med check currently using topical of urea and nail lacquer to L 1st toenail without issues, reports that she is using it every other day. Reports also some swelling to legs and has not been wearing compression stocking faithfully. Patient denies nausea, vomitting, fever, chills or any acute symptosm. Denies any problems with topical medication. No other issues noted.   Patient Active Problem List   Diagnosis Date Noted  . Acid reflux 03/08/2013  . Neck pain 04/05/2012  . Vitamin D deficiency disease 01/07/2012  . Obesity (BMI 35.0-39.9 without comorbidity) 01/07/2012  . Chest pain 02/12/2011  . Asthma 02/12/2011    Current Outpatient Medications on File Prior to Visit  Medication Sig Dispense Refill  . albuterol (PROVENTIL HFA;VENTOLIN HFA) 108 (90 Base) MCG/ACT inhaler Inhale 1-2 puffs into the lungs every 6 (six) hours as needed for wheezing or shortness of breath. 1 Inhaler 0  . benzonatate (TESSALON) 100 MG capsule Take 1 capsule (100 mg total) by mouth every 8 (eight) hours. 21 capsule 0  . Cholecalciferol (VITAMIN D) 2000 units tablet Take by mouth.    . cyclobenzaprine (FLEXERIL) 5 MG tablet Take 1 tablet (5 mg total) by mouth 3 (three) times daily as needed for muscle spasms. 30 tablet 0  . EPINEPHrine (EPIPEN 2-PAK) 0.3 mg/0.3 mL IJ SOAJ injection Inject into the muscle.    . furosemide (LASIX) 20 MG tablet Take by mouth.    Marland Kitchen ibuprofen (ADVIL,MOTRIN) 800 MG tablet Take 1 tablet (800 mg total) by mouth every 8 (eight) hours as needed. 30 tablet 0  . ketoconazole (NIZORAL) 2 % cream Apply 1 application topically daily. 60 g 2  . meloxicam (MOBIC) 15 MG tablet Take 1 tablet (15 mg total) by mouth daily. meloxicam 15 mg tablet 30 tablet 0  . NON FORMULARY [facility]  Onychomycosis Nail Lacquer -  Fluconazole 2%, Terbinafine 1% DMSO Apply to affected nail once daily Qty. 120 gm 3 refills     . NON FORMULARY [facility]  Onychomycosis Nail Lacquer -  Fluconazole 2%, Terbinafine 1% DMSO Apply to affected nail once daily Qty. 120 gm 3 refills    . NON FORMULARY [facility]  Urea Cream - 40% Urea Apply 1-2 grams to affected area 3-4 times daily Qty. 120 gm 3 refills    . NONFORMULARY OR COMPOUNDED ITEM [facility]:  Onychomycosis Nail Lacquer - Fluconazole 2%, Terbinafine 1%, DMSO apply to affected area daily. 120 each 2  . pantoprazole (PROTONIX) 20 MG tablet pantoprazole 20 mg tablet,delayed release    . phenazopyridine (PYRIDIUM) 100 MG tablet Take by mouth.    . potassium chloride (K-DUR,KLOR-CON) 10 MEQ tablet Take by mouth.    . predniSONE (STERAPRED UNI-PAK 21 TAB) 10 MG (21) TBPK tablet Take by mouth daily. Take 6 tabs by mouth day 1, then 5 tabs, then 4 tabs, then 3 tabs, 2 tabs, then 1 tab for the last day 21 tablet 0   No current facility-administered medications on file prior to visit.     Allergies  Allergen Reactions  . Bee Venom Anaphylaxis  . Peanuts [Peanut Oil] Anaphylaxis    Objective:  General: Alert and oriented x3 in no acute distress  Dermatology: No open lesions bilateral lower extremities, no webspace macerations, no ecchymosis bilateral, all nails x 10 are elongated, thick and mycotic with the left 1st toenail most invovled.  Vascular: Dorsalis Pedis and Posterior Tibial  pedal pulses palpable, Capillary Fill Time 3 seconds,(+) pedal hair growth bilateral, trace edema bilateral lower extremities, Temperature gradient within normal limits.  Neurology: Johney Maine sensation intact via light touch bilateral.  Musculoskeletal: No tenderness with palpation at peroneal tendon on right. Range of motion within normal limits with mild guarding on right ankle. Strength within normal limits in all groups bilateral.   Assessment and Plan: Problem List Items Addressed This Visit    None    Visit Diagnoses    Long-term use of high-risk  medication    -  Primary   Tinea pedis of both feet       Onychomycosis       Toe pain, bilateral          -Complete examination performed -Mechanically debrided all nails using sterile nipper without incident and filed nails with dremel without incident  -Continue with [facility] topical to nails as previous  -Recommend continue with compression stockings daily for edema control  -Patient to return to office as needed or sooner if condition worsens.  Landis Martins, DPM

## 2019-02-01 ENCOUNTER — Ambulatory Visit: Payer: Federal, State, Local not specified - PPO | Admitting: Cardiology

## 2019-02-01 ENCOUNTER — Encounter: Payer: Self-pay | Admitting: Cardiology

## 2019-02-01 ENCOUNTER — Other Ambulatory Visit: Payer: Self-pay

## 2019-02-01 DIAGNOSIS — R600 Localized edema: Secondary | ICD-10-CM

## 2019-02-01 HISTORY — DX: Localized edema: R60.0

## 2019-02-01 NOTE — Progress Notes (Signed)
Virtual Visit via Video Note   Subjective:   Taylor Reynolds, female    DOB: 1961/04/04, 58 y.o.   MRN: [id number]   I connected with the patient on 02/01/19 by a video enabled telemedicine application and verified that I am speaking with the correct person using two identifiers.     I discussed the limitations of evaluation and management by telemedicine and the availability of in person appointments. The patient expressed understanding and agreed to proceed.   This visit type was conducted due to national recommendations for restrictions regarding the COVID-19 Pandemic (e.g. social distancing).  This format is felt to be most appropriate for this patient at this time.  All issues noted in this document were discussed and addressed.  No physical exam was performed (except for noted visual exam findings with Tele health visits).  The patient has consented to conduct a Tele health visit and understands insurance will be billed.     Chief complaint:  Leg edema  HPI  58 year old American female with morbid obesity, asthma, family history of coronary artery disease, seen for atypical chest pain and leg edema.  Cardiac workup was reassuring. I suspected leg edema could be due to venous insufficiency. I recommended leg elevation. Compression stockings.  She continues to have leg swelling, especially in the left leg. She works at the [employer], and has to be standing on her feet 10-12 hours. She does not wear compression stockings regularly.    Past Medical History:  Diagnosis Date  . Abnormal Pap smear 06/1989  . Anal itching 05/2009  . ASCUS (atypical squamous cells of undetermined significance) on Pap smear 2006  . Asthma   . 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 Chest pressure   . H/O pelvic mass 2003  . History of irregular menstrual bleeding 02/2011  . History of measles, mumps, or rubella   . Ovarian cyst 01/2002  . Pelvic pain 12/2005  . Reflux   . Strain of shoulder, left 04/2006   . Urge incontinence 2001  . Varicella      Past Surgical History:  Procedure Laterality Date  . ACNE CYST REMOVAL     Under eye  . CESAREAN SECTION       Social History   Socioeconomic History  . Marital status: Divorced    Spouse name: Not on file  . Number of children: Not on file  . Years of education: Not on file  . Highest education level: Not on file  Occupational History  . Not on file  Social Needs  . Financial resource strain: Not on file  . Food insecurity:    Worry: Not on file    Inability: Not on file  . Transportation needs:    Medical: Not on file    Non-medical: Not on file  Tobacco Use  . Smoking status: Never Smoker  . Smokeless tobacco: Never Used  Substance and Sexual Activity  . Alcohol use: No  . Drug use: No  . Sexual activity: Not Currently  Lifestyle  . Physical activity:    Days per week: Not on file    Minutes per session: Not on file  . Stress: Not on file  Relationships  . Social connections:    Talks on phone: Not on file    Gets together: Not on file    Attends religious service: Not on file    Active member of club or organization: Not on file    Attends meetings of clubs  or organizations: Not on file    Relationship status: Not on file  . Intimate partner violence:    Fear of current or ex partner: Not on file    Emotionally abused: Not on file    Physically abused: Not on file    Forced sexual activity: Not on file  Other Topics Concern  . Not on file  Social History Narrative  . Not on file     Family History  Problem Relation Age of Onset  . Emphysema Sister   . Stroke Maternal Grandmother   . Heart disease Maternal Grandmother   . Diabetes Father   . Diabetes Other   . Heart disease Maternal Grandfather   . Asthma Maternal Grandfather   . Breast cancer Neg Hx      Current Outpatient Medications on File Prior to Visit  Medication Sig Dispense Refill  . albuterol (PROVENTIL HFA;VENTOLIN HFA) 108 (90  Base) MCG/ACT inhaler Inhale 1-2 puffs into the lungs every 6 (six) hours as needed for wheezing or shortness of breath. 1 Inhaler 0  . benzonatate (TESSALON) 100 MG capsule Take 1 capsule (100 mg total) by mouth every 8 (eight) hours. 21 capsule 0  . Cholecalciferol (VITAMIN D) 2000 units tablet Take by mouth.    . cyclobenzaprine (FLEXERIL) 5 MG tablet Take 1 tablet (5 mg total) by mouth 3 (three) times daily as needed for muscle spasms. 30 tablet 0  . EPINEPHrine (EPIPEN 2-PAK) 0.3 mg/0.3 mL IJ SOAJ injection Inject into the muscle.    . furosemide (LASIX) 20 MG tablet Take by mouth.    Marland Kitchen ibuprofen (ADVIL,MOTRIN) 800 MG tablet Take 1 tablet (800 mg total) by mouth every 8 (eight) hours as needed. 30 tablet 0  . ketoconazole (NIZORAL) 2 % cream Apply 1 application topically daily. 60 g 2  . meloxicam (MOBIC) 15 MG tablet Take 1 tablet (15 mg total) by mouth daily. meloxicam 15 mg tablet 30 tablet 0  . NON FORMULARY [facility]  Onychomycosis Nail Lacquer -  Fluconazole 2%, Terbinafine 1% DMSO Apply to affected nail once daily Qty. 120 gm 3 refills    . NON FORMULARY [facility]  Onychomycosis Nail Lacquer -  Fluconazole 2%, Terbinafine 1% DMSO Apply to affected nail once daily Qty. 120 gm 3 refills    . NON FORMULARY [facility]  Urea Cream - 40% Urea Apply 1-2 grams to affected area 3-4 times daily Qty. 120 gm 3 refills    . NONFORMULARY OR COMPOUNDED ITEM [facility]:  Onychomycosis Nail Lacquer - Fluconazole 2%, Terbinafine 1%, DMSO apply to affected area daily. 120 each 2  . pantoprazole (PROTONIX) 20 MG tablet pantoprazole 20 mg tablet,delayed release    . phenazopyridine (PYRIDIUM) 100 MG tablet Take by mouth.    . potassium chloride (K-DUR,KLOR-CON) 10 MEQ tablet Take by mouth.    . predniSONE (STERAPRED UNI-PAK 21 TAB) 10 MG (21) TBPK tablet Take by mouth daily. Take 6 tabs by mouth day 1, then 5 tabs, then 4 tabs, then 3 tabs, 2 tabs, then 1 tab for  the last day 21 tablet 0   No current facility-administered medications on file prior to visit.     Cardiovascular studies:  Echocardiogram 10/16/2018 1. Left ventricle cavity is normal in size. Mild concentric hypertrophy of the left ventricle. Normal global wall motion. Normal diastolic filling pattern. Calculated EF 65%. 2. Left atrial cavity is borderline dilated. 3. Mild (Grade I) mitral regurgitation. 4. Mild to moderate tricuspid regurgitation. No  evidence of pulmonary hypertension.  Treadmill exercise stress test 10/13/2018: Indication: CP  The patient exercised on Bruce protocol for  7:00 min. Patient achieved  7.05 METS and reached HR  174 bpm, which is   106% of maximum age-predicted HR.  Stress test terminated due to Fatigue. Resting EKG demonstrates NSR, IRBBB. ST Changes: With peak exercise there was no ST-T changes of ischemia.   Arrhythmias: Occasional PVC . Chest Pain: none.  BP Response to Exercise: Resting hypertension 172/86- exaggerated response 252/56 mm Hg. HR Response to Exercise: Exaggerated HR response suggests decreased aerobic tolerence. Exercise capacity was below average for age. Recommendations: Continue primary/secondary prevention.  Recent labs: Results for Taylor Reynolds, Taylor Reynolds (MRN [id number]) as of 02/01/2019 07:27  Ref. Range 09/08/2018 13:07  TCO2 Latest Ref Range: 22 - 32 mmol/L 26  Sodium Latest Ref Range: 135 - 145 mmol/L 141  Potassium Latest Ref Range: 3.5 - 5.1 mmol/L 4.0  Chloride Latest Ref Range: 98 - 111 mmol/L 107  Glucose Latest Ref Range: 70 - 99 mg/dL 89  BUN Latest Ref Range: 6 - 20 mg/dL 10  Creatinine Latest Ref Range: 0.44 - 1.00 mg/dL 0.90  Calcium Ionized Latest Ref Range: 1.15 - 1.40 mmol/L 1.22   Results for Taylor Reynolds, Taylor Reynolds (MRN [id number]) as of 02/01/2019 07:27  Ref. Range 01/05/2018 10:40 09/08/2018 13:07  WBC Latest Ref Range: 3.8 - 10.8 Thousand/uL 4.0   WBC mixed population Latest Ref Range: 200 - 950 cells/uL  468   RBC Latest Ref Range: 3.80 - 5.10 Million/uL 4.23   Hemoglobin Latest Ref Range: 12.0 - 15.0 g/dL 12.1 13.6  HCT Latest Ref Range: 36.0 - 46.0 % 35.9 40.0  MCV Latest Ref Range: 80.0 - 100.0 fL 84.9   MCH Latest Ref Range: 27.0 - 33.0 pg 28.6   MCHC Latest Ref Range: 32.0 - 36.0 g/dL 33.7   RDW Latest Ref Range: 11.0 - 15.0 % 13.2   Platelets Latest Ref Range: 140 - 400 Thousand/uL 206   MPV Latest Ref Range: 7.5 - 12.5 fL 12.7 (H)     Review of Systems  Constitution: Negative for decreased appetite, malaise/fatigue, weight gain and weight loss.  HENT: Negative for congestion.   Eyes: Negative for visual disturbance.  Cardiovascular: Positive for leg swelling. Negative for chest pain, dyspnea on exertion, palpitations and syncope.  Respiratory: Negative for shortness of breath.   Endocrine: Negative for cold intolerance.  Hematologic/Lymphatic: Does not bruise/bleed easily.  Skin: Negative for itching and rash.  Musculoskeletal: Negative for myalgias.  Gastrointestinal: Negative for abdominal pain, nausea and vomiting.  Genitourinary: Negative for dysuria.  Neurological: Negative for dizziness and weakness.  Psychiatric/Behavioral: The patient is not nervous/anxious.   All other systems reviewed and are negative.        Vitals:   01/01/19 1039  BP: 128/70   (Measured by the patient using a home BP monitor)   Observation/findings during video visit   Objective:    Physical Exam  Constitutional: She is oriented to person, place, and time. She appears well-developed and well-nourished. No distress.  Pulmonary/Chest: Effort normal.  Musculoskeletal:        General: Edema (LLE 1+) present.  Neurological: She is alert and oriented to person, place, and time.  Psychiatric: She has a normal mood and affect.  Nursing note and vitals reviewed.         Assessment & Recommendations:    58 year old American female with morbid obesity, asthma, family history of  coronary artery  disease, seen for atypical chest pain and leg edema.  Leg edema: Echocardiogram does not explain her edema. LFT's have been normal. I suspect her leg edema is due to venous insufficiency. This is compounded by the nature of her job, where she has to stand a lot. I have encouraged her to wear compression stockings regularly while awake, and keep her legs elevated when she sleeps. I will obtain LLE [place] in June 2020, to rule out DT as well as to evaluate for venous insufficiency. I will see her for follow up in July 2020.      Nigel Mormon, MD [facility] Pager: [phone] Office: [phone] If no answer Cell [phone]

## 2019-02-01 NOTE — Patient Instructions (Signed)
Left leg [place] to be performed in June 2020 Follow up in July 2020  Thanks MJP

## 2019-03-28 ENCOUNTER — Ambulatory Visit (INDEPENDENT_AMBULATORY_CARE_PROVIDER_SITE_OTHER): Payer: Federal, State, Local not specified - PPO

## 2019-03-28 ENCOUNTER — Other Ambulatory Visit: Payer: Self-pay

## 2019-03-28 DIAGNOSIS — R600 Localized edema: Secondary | ICD-10-CM

## 2019-04-02 NOTE — Progress Notes (Signed)
SPOKE TO PATIENT ADVISED HER OF TEST RESULTS.

## 2019-04-24 ENCOUNTER — Encounter: Payer: Self-pay | Admitting: Cardiology

## 2019-04-24 ENCOUNTER — Other Ambulatory Visit: Payer: Self-pay

## 2019-04-24 ENCOUNTER — Ambulatory Visit: Payer: Federal, State, Local not specified - PPO | Admitting: Cardiology

## 2019-04-24 DIAGNOSIS — R600 Localized edema: Secondary | ICD-10-CM

## 2019-04-24 NOTE — Progress Notes (Signed)
Virtual Visit via Video Note   Subjective:   Taylor Reynolds, female    DOB: 1961/02/28, 58 y.o.   MRN: [id number]   I connected with the patient on 04/24/19 by a video enabled telemedicine application and verified that I am speaking with the correct person using two identifiers.     I discussed the limitations of evaluation and management by telemedicine and the availability of in person appointments. The patient expressed understanding and agreed to proceed.   This visit type was conducted due to national recommendations for restrictions regarding the COVID-19 Pandemic (e.g. social distancing).  This format is felt to be most appropriate for this patient at this time.  All issues noted in this document were discussed and addressed.  No physical exam was performed (except for noted visual exam findings with Tele health visits).  The patient has consented to conduct a Tele health visit and understands insurance will be billed.     Chief complaint:  Leg edema  HPI  58 year old American female with morbid obesity, asthma, family history of coronary artery disease, seen for atypical chest pain and leg edema.  Cardiac workup was reassuring. LE [place] did not show DVT.  Patient has not had any leg edema in the last 2 weeks while she has not been working.  Patient was tested positive for COVID yesterday and she is in quarantine for 2 weeks.  She denies any severe shortness of breath.    Past Medical History:  Diagnosis Date  . Abnormal Pap smear 06/1989  . Anal itching 05/2009  . ASCUS (atypical squamous cells of undetermined significance) on Pap smear 2006  . Asthma   . 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 Chest pressure   . H/O pelvic mass 2003  . History of irregular menstrual bleeding 02/2011  . History of measles, mumps, or rubella   . Leg edema 02/01/2019  . Ovarian cyst 01/2002  . Pelvic pain 12/2005  . Reflux   . Strain of shoulder, left 04/2006  . Urge incontinence 2001  . Varicella       Past Surgical History:  Procedure Laterality Date  . ACNE CYST REMOVAL     Under eye  . CESAREAN SECTION       Social History   Socioeconomic History  . Marital status: Divorced    Spouse name: Not on file  . Number of children: 3  . Years of education: Not on file  . Highest education level: Not on file  Occupational History  . Not on file  Social Needs  . Financial resource strain: Not on file  . Food insecurity    Worry: Not on file    Inability: Not on file  . Transportation needs    Medical: Not on file    Non-medical: Not on file  Tobacco Use  . Smoking status: Never Smoker  . Smokeless tobacco: Never Used  Substance and Sexual Activity  . Alcohol use: No  . Drug use: No  . Sexual activity: Not Currently  Lifestyle  . Physical activity    Days per week: Not on file    Minutes per session: Not on file  . Stress: Not on file  Relationships  . Social Herbalist on phone: Not on file    Gets together: Not on file    Attends religious service: Not on file    Active member of club or organization: Not on file    Attends meetings of  clubs or organizations: Not on file    Relationship status: Not on file  . Intimate partner violence    Fear of current or ex partner: Not on file    Emotionally abused: Not on file    Physically abused: Not on file    Forced sexual activity: Not on file  Other Topics Concern  . Not on file  Social History Narrative  . Not on file     Family History  Problem Relation Age of Onset  . Emphysema Sister   . Stroke Maternal Grandmother   . Heart disease Maternal Grandmother   . Diabetes Father   . Diabetes Other   . Heart disease Maternal Grandfather   . Asthma Maternal Grandfather   . Breast cancer Neg Hx      Current Outpatient Medications on File Prior to Visit  Medication Sig Dispense Refill  . albuterol (PROVENTIL HFA;VENTOLIN HFA) 108 (90 Base) MCG/ACT inhaler Inhale 1-2 puffs into the lungs every  6 (six) hours as needed for wheezing or shortness of breath. 1 Inhaler 0  . benzonatate (TESSALON) 100 MG capsule Take 1 capsule (100 mg total) by mouth every 8 (eight) hours. (Patient not taking: Reported on 02/01/2019) 21 capsule 0  . Cholecalciferol (VITAMIN D) 2000 units tablet Take by mouth.    . cyclobenzaprine (FLEXERIL) 5 MG tablet Take 1 tablet (5 mg total) by mouth 3 (three) times daily as needed for muscle spasms. (Patient not taking: Reported on 02/01/2019) 30 tablet 0  . EPINEPHrine (EPIPEN 2-PAK) 0.3 mg/0.3 mL IJ SOAJ injection Inject into the muscle.    . furosemide (LASIX) 20 MG tablet Take by mouth as needed.     Marland Kitchen ibuprofen (ADVIL,MOTRIN) 800 MG tablet Take 1 tablet (800 mg total) by mouth every 8 (eight) hours as needed. 30 tablet 0  . ketoconazole (NIZORAL) 2 % cream Apply 1 application topically daily. 60 g 2  . meloxicam (MOBIC) 15 MG tablet Take 1 tablet (15 mg total) by mouth daily. meloxicam 15 mg tablet (Patient not taking: Reported on 02/01/2019) 30 tablet 0  . NON FORMULARY [facility]  Onychomycosis Nail Lacquer -  Fluconazole 2%, Terbinafine 1% DMSO Apply to affected nail once daily Qty. 120 gm 3 refills    . NON FORMULARY [facility]  Onychomycosis Nail Lacquer -  Fluconazole 2%, Terbinafine 1% DMSO Apply to affected nail once daily Qty. 120 gm 3 refills    . NON FORMULARY [facility]  Urea Cream - 40% Urea Apply 1-2 grams to affected area 3-4 times daily Qty. 120 gm 3 refills    . NONFORMULARY OR COMPOUNDED ITEM [facility]:  Onychomycosis Nail Lacquer - Fluconazole 2%, Terbinafine 1%, DMSO apply to affected area daily. 120 each 2  . pantoprazole (PROTONIX) 20 MG tablet pantoprazole 20 mg tablet,delayed release    . phenazopyridine (PYRIDIUM) 100 MG tablet Take by mouth.    . potassium chloride (K-DUR,KLOR-CON) 10 MEQ tablet Take by mouth as needed.     . predniSONE (STERAPRED UNI-PAK 21 TAB) 10 MG (21) TBPK tablet Take by mouth  daily. Take 6 tabs by mouth day 1, then 5 tabs, then 4 tabs, then 3 tabs, 2 tabs, then 1 tab for the last day 21 tablet 0   No current facility-administered medications on file prior to visit.     Cardiovascular studies:  Lower Extremity Venous Duplex left leg 03/28/2019: No evidence of deep vein thrombosis of the left lower extremity with normal venous return. Tissue  edema noted.   Echocardiogram 10/16/2018 1. Left ventricle cavity is normal in size. Mild concentric hypertrophy of the left ventricle. Normal global wall motion. Normal diastolic filling pattern. Calculated EF 65%. 2. Left atrial cavity is borderline dilated. 3. Mild (Grade I) mitral regurgitation. 4. Mild to moderate tricuspid regurgitation. No evidence of pulmonary hypertension.  Treadmill exercise stress test 10/13/2018: Indication: CP  The patient exercised on Bruce protocol for  7:00 min. Patient achieved  7.05 METS and reached HR  174 bpm, which is   106% of maximum age-predicted HR.  Stress test terminated due to Fatigue. Resting EKG demonstrates NSR, IRBBB. ST Changes: With peak exercise there was no ST-T changes of ischemia.   Arrhythmias: Occasional PVC . Chest Pain: none.  BP Response to Exercise: Resting hypertension 172/86- exaggerated response 252/56 mm Hg. HR Response to Exercise: Exaggerated HR response suggests decreased aerobic tolerence. Exercise capacity was below average for age. Recommendations: Continue primary/secondary prevention.  Recent labs: Results for KERRA, GUILFOIL (MRN [id number]) as of 02/01/2019 07:27  Ref. Range 09/08/2018 13:07  TCO2 Latest Ref Range: 22 - 32 mmol/L 26  Sodium Latest Ref Range: 135 - 145 mmol/L 141  Potassium Latest Ref Range: 3.5 - 5.1 mmol/L 4.0  Chloride Latest Ref Range: 98 - 111 mmol/L 107  Glucose Latest Ref Range: 70 - 99 mg/dL 89  BUN Latest Ref Range: 6 - 20 mg/dL 10  Creatinine Latest Ref Range: 0.44 - 1.00 mg/dL 0.90  Calcium Ionized Latest Ref  Range: 1.15 - 1.40 mmol/L 1.22   Results for TIFFANE, SHELDON (MRN [id number]) as of 02/01/2019 07:27  Ref. Range 01/05/2018 10:40 09/08/2018 13:07  WBC Latest Ref Range: 3.8 - 10.8 Thousand/uL 4.0   WBC mixed population Latest Ref Range: 200 - 950 cells/uL 468   RBC Latest Ref Range: 3.80 - 5.10 Million/uL 4.23   Hemoglobin Latest Ref Range: 12.0 - 15.0 g/dL 12.1 13.6  HCT Latest Ref Range: 36.0 - 46.0 % 35.9 40.0  MCV Latest Ref Range: 80.0 - 100.0 fL 84.9   MCH Latest Ref Range: 27.0 - 33.0 pg 28.6   MCHC Latest Ref Range: 32.0 - 36.0 g/dL 33.7   RDW Latest Ref Range: 11.0 - 15.0 % 13.2   Platelets Latest Ref Range: 140 - 400 Thousand/uL 206   MPV Latest Ref Range: 7.5 - 12.5 fL 12.7 (H)     Review of Systems  Constitution: Negative for decreased appetite, malaise/fatigue, weight gain and weight loss.  HENT: Negative for congestion.   Eyes: Negative for visual disturbance.  Cardiovascular: Negative for chest pain, dyspnea on exertion, leg swelling, palpitations and syncope.  Respiratory: Negative for shortness of breath.   Endocrine: Negative for cold intolerance.  Hematologic/Lymphatic: Does not bruise/bleed easily.  Skin: Negative for itching and rash.  Musculoskeletal: Negative for myalgias.  Gastrointestinal: Negative for abdominal pain, nausea and vomiting.  Genitourinary: Negative for dysuria.  Neurological: Negative for dizziness and weakness.  Psychiatric/Behavioral: The patient is not nervous/anxious.   All other systems reviewed and are negative.        No vitals available.   Observation/findings during video visit   Objective:    Physical Exam  Constitutional: She is oriented to person, place, and time. She appears well-developed and well-nourished. No distress.  Pulmonary/Chest: Effort normal.  Musculoskeletal:        General: No edema (LLE 1+).  Neurological: She is alert and oriented to person, place, and time.  Psychiatric: She has a normal mood and  affect.  Nursing note and vitals reviewed.         Assessment & Recommendations:    57 year old American female with morbid obesity, asthma, family history of coronary artery disease, seen for atypical chest pain and leg edema.  Leg edema: Likely due to venous insufficieny. Currently resolved. Will send prescription of compression stockings to patient's pharmacy.   Recommend two week quarantine, while she recovers from [place].  I will see her on as needed basis.     Nigel Mormon, MD [facility] Pager: [phone] Office: [phone] If no answer Cell [phone]

## 2019-04-27 ENCOUNTER — Telehealth: Payer: Self-pay

## 2019-04-27 NOTE — Telephone Encounter (Signed)
Pt asking for compression stockings sent to her pharmacy.

## 2019-04-27 NOTE — Telephone Encounter (Signed)
Insurance will not cover these stockings for her, pt asked what grade she needs and she will find some on her own. Or can she have a copy of the prescription?

## 2019-04-27 NOTE — Telephone Encounter (Signed)
I did send a prescription. Can you check with the pharmacy please? Also, patient is in quarantine for [place]. She should wait for two weeks before she picks up the stockings.  Thanks MJP

## 2019-04-27 NOTE — Telephone Encounter (Signed)
20-30 mmHg. Please ask her not to worry about this until after she has completed quarantine for two weeks, given that she is tested positive for COVID.  Thanks MJP

## 2019-05-03 ENCOUNTER — Ambulatory Visit: Payer: Federal, State, Local not specified - PPO | Admitting: Cardiology

## 2019-09-07 DIAGNOSIS — K635 Polyp of colon: Secondary | ICD-10-CM | POA: Insufficient documentation

## 2019-10-05 ENCOUNTER — Other Ambulatory Visit: Payer: Self-pay | Admitting: Obstetrics and Gynecology

## 2019-10-05 DIAGNOSIS — Z1231 Encounter for screening mammogram for malignant neoplasm of breast: Secondary | ICD-10-CM

## 2019-11-27 ENCOUNTER — Other Ambulatory Visit: Payer: Self-pay

## 2019-11-27 ENCOUNTER — Ambulatory Visit
Admission: RE | Admit: 2019-11-27 | Discharge: 2019-11-27 | Disposition: A | Discharge status: Home / Self Care | Payer: Federal, State, Local not specified - PPO | Source: Ambulatory Visit | Attending: Obstetrics and Gynecology | Admitting: Obstetrics and Gynecology

## 2019-11-27 DIAGNOSIS — Z1231 Encounter for screening mammogram for malignant neoplasm of breast: Secondary | ICD-10-CM

## 2020-05-06 ENCOUNTER — Other Ambulatory Visit: Payer: Self-pay

## 2020-05-06 ENCOUNTER — Encounter: Payer: Self-pay | Admitting: Sports Medicine

## 2020-05-06 ENCOUNTER — Ambulatory Visit: Payer: Federal, State, Local not specified - PPO | Admitting: Sports Medicine

## 2020-05-06 DIAGNOSIS — M79674 Pain in right toe(s): Secondary | ICD-10-CM

## 2020-05-06 DIAGNOSIS — M79675 Pain in left toe(s): Secondary | ICD-10-CM | POA: Diagnosis not present

## 2020-05-06 DIAGNOSIS — Z79899 Other long term (current) drug therapy: Secondary | ICD-10-CM | POA: Diagnosis not present

## 2020-05-06 DIAGNOSIS — B353 Tinea pedis: Secondary | ICD-10-CM | POA: Diagnosis not present

## 2020-05-06 DIAGNOSIS — B351 Tinea unguium: Secondary | ICD-10-CM

## 2020-05-06 NOTE — Progress Notes (Signed)
Subjective: Taylor Reynolds is a 59 y.o. female patient who returns to office for follow-up evaluation of nail fungus and for nail trim.  Patient reports that she occasionally uses the topical to her nails and has dry skin and wants to discuss treatments for that as well. No other issues noted.   Patient Active Problem List   Diagnosis Date Noted   Polyp of colon 09/07/2019   Leg edema 02/01/2019   Acid reflux 03/08/2013   Neck pain 04/05/2012   Vitamin D deficiency disease 01/07/2012   Obesity (BMI 35.0-39.9 without comorbidity) 01/07/2012   Chest pain 02/12/2011   Asthma 02/12/2011    Current Outpatient Medications on File Prior to Visit  Medication Sig Dispense Refill   albuterol (PROVENTIL HFA;VENTOLIN HFA) 108 (90 Base) MCG/ACT inhaler Inhale 1-2 puffs into the lungs every 6 (six) hours as needed for wheezing or shortness of breath. 1 Inhaler 0   benzonatate (TESSALON) 100 MG capsule Take 1 capsule (100 mg total) by mouth every 8 (eight) hours. 21 capsule 0   Cholecalciferol (VITAMIN D) 2000 units tablet Take by mouth.     cyclobenzaprine (FLEXERIL) 5 MG tablet Take 1 tablet (5 mg total) by mouth 3 (three) times daily as needed for muscle spasms. 30 tablet 0   EPINEPHrine (EPIPEN 2-PAK) 0.3 mg/0.3 mL IJ SOAJ injection Inject into the muscle.     furosemide (LASIX) 20 MG tablet Take by mouth as needed.      hydrOXYzine (ATARAX/VISTARIL) 25 MG tablet hydroxyzine HCl 25 mg tablet     ibuprofen (ADVIL,MOTRIN) 800 MG tablet Take 1 tablet (800 mg total) by mouth every 8 (eight) hours as needed. 30 tablet 0   ketoconazole (NIZORAL) 2 % cream Apply 1 application topically daily. 60 g 2   LO LOESTRIN FE 1 MG-10 MCG / 10 MCG tablet Take 1 tablet by mouth daily.     meloxicam (MOBIC) 15 MG tablet Take 1 tablet (15 mg total) by mouth daily. meloxicam 15 mg tablet 30 tablet 0   NON FORMULARY [facility]  Onychomycosis Nail Lacquer -  Fluconazole 2%,  Terbinafine 1% DMSO Apply to affected nail once daily Qty. 120 gm 3 refills     NON FORMULARY [facility]  Onychomycosis Nail Lacquer -  Fluconazole 2%, Terbinafine 1% DMSO Apply to affected nail once daily Qty. 120 gm 3 refills     NON FORMULARY [facility]  Urea Cream - 40% Urea Apply 1-2 grams to affected area 3-4 times daily Qty. 120 gm 3 refills     NONFORMULARY OR COMPOUNDED ITEM [facility]:  Onychomycosis Nail Lacquer - Fluconazole 2%, Terbinafine 1%, DMSO apply to affected area daily. 120 each 2   pantoprazole (PROTONIX) 20 MG tablet pantoprazole 20 mg tablet,delayed release     phenazopyridine (PYRIDIUM) 100 MG tablet Take by mouth.     potassium chloride (K-DUR,KLOR-CON) 10 MEQ tablet Take by mouth as needed.      predniSONE (STERAPRED UNI-PAK 21 TAB) 10 MG (21) TBPK tablet Take by mouth daily. Take 6 tabs by mouth day 1, then 5 tabs, then 4 tabs, then 3 tabs, 2 tabs, then 1 tab for the last day 21 tablet 0   Vitamin D, Ergocalciferol, (DRISDOL) 1.25 MG (50000 UNIT) CAPS capsule Take 50,000 Units by mouth once a week.     No current facility-administered medications on file prior to visit.    Allergies  Allergen Reactions   Bee Venom Anaphylaxis   Peanuts [Peanut Oil] Anaphylaxis  Objective:  General: Alert and oriented x3 in no acute distress  Dermatology: No open lesions bilateral lower extremities, no webspace macerations, no ecchymosis bilateral, all nails x 10 are elongated, thick and mycotic with the left 1st toenail most invovled like before.  Vascular: Dorsalis Pedis and Posterior Tibial pedal pulses palpable, Capillary Fill Time 3 seconds,(+) pedal hair growth bilateral, trace edema bilateral lower extremities, Temperature gradient within normal limits.  Neurology: Johney Maine sensation intact via light touch bilateral.  Musculoskeletal: No symptomatic pedal complaints noted at this visit. Strength within normal limits in all groups  bilateral.   Assessment and Plan: Problem List Items Addressed This Visit    None    Visit Diagnoses    Onychomycosis    -  Primary   Tinea pedis of both feet       Toe pain, bilateral       Long-term use of high-risk medication          -Complete examination performed -Mechanically debrided all nails using sterile nipper without incident and filed nails with dremel without incident  -Continue with [facility] topical to nails as previous as tolerated -Gave sample of foot miracle cream for patient to use for dry skin -Patient to return to office in 3 months or sooner if condition worsens.  Landis Martins, DPM

## 2020-05-08 ENCOUNTER — Other Ambulatory Visit: Payer: Self-pay

## 2020-05-08 ENCOUNTER — Encounter (HOSPITAL_COMMUNITY): Payer: Self-pay

## 2020-05-08 ENCOUNTER — Ambulatory Visit (HOSPITAL_COMMUNITY)
Admission: EM | Admit: 2020-05-08 | Discharge: 2020-05-08 | Disposition: A | Discharge status: Home / Self Care | Payer: Federal, State, Local not specified - PPO | Attending: Physician Assistant | Admitting: Physician Assistant

## 2020-05-08 ENCOUNTER — Ambulatory Visit (HOSPITAL_COMMUNITY)
Admission: RE | Admit: 2020-05-08 | Discharge: 2020-05-08 | Disposition: A | Discharge status: Home / Self Care | Payer: Federal, State, Local not specified - PPO | Source: Ambulatory Visit | Attending: Physician Assistant | Admitting: Physician Assistant

## 2020-05-08 DIAGNOSIS — R600 Localized edema: Secondary | ICD-10-CM

## 2020-05-08 DIAGNOSIS — M7989 Other specified soft tissue disorders: Secondary | ICD-10-CM | POA: Diagnosis not present

## 2020-05-08 NOTE — Discharge Instructions (Signed)
We have schedule an Ultrasound to be done at [facility] today at 11 AM. Please report there, I will call you once we have the results.

## 2020-05-08 NOTE — ED Triage Notes (Signed)
Patient reports left foot swelling x4 days. States this has happened before but never this amount of swelling. States she went to get her COVID vaccine and the nurse told her to make sure she did not have a blood clot first. Denies pain in the calf. Reports she began taking birth control last month.

## 2020-05-08 NOTE — ED Provider Notes (Signed)
[facility]    CSN: [id number] Arrival date & time: 05/08/20  5993      History   Chief Complaint Chief Complaint  Patient presents with  . Foot Swelling    HPI Taylor Reynolds is a 59 y.o. female.   Patient reports for 3-day history of left greater than right lower extremity swelling.  She does report history of having lower extremity swelling in the left can be more than the right however this is significantly more than its been before.  She noticed this starting 3 days ago.  She recently got a Covid vaccine and the nurse mentioned that she should make sure she does not have a blood clot.  She denies history of blood clots.  Denies chest pain or shortness of breath.  She does report she started a oral hormone birth control about a  month ago.  Denies calf pain.  Patient does report she is on her feet a lot and does not elevate her feet at night.  She is sleeps sitting up at work from time to time.     Past Medical History:  Diagnosis Date  . Abnormal Pap smear 06/1989  . Anal itching 05/2009  . ASCUS (atypical squamous cells of undetermined significance) on Pap smear 2006  . Asthma   . 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 Chest pressure   . H/O pelvic mass 2003  . History of irregular menstrual bleeding 02/2011  . History of measles, mumps, or rubella   . Leg edema 02/01/2019  . Ovarian cyst 01/2002  . Pelvic pain 12/2005  . Reflux   . Strain of shoulder, left 04/2006  . Urge incontinence 2001  . Varicella     Patient Active Problem List   Diagnosis Date Noted  . Polyp of colon 09/07/2019  . Leg edema 02/01/2019  . Acid reflux 03/08/2013  . Neck pain 04/05/2012  . Vitamin D deficiency disease 01/07/2012  . Obesity (BMI 35.0-39.9 without comorbidity) 01/07/2012  . Chest pain 02/12/2011  . Asthma 02/12/2011    Past Surgical History:  Procedure Laterality Date  . ACNE CYST REMOVAL     Under eye  . CESAREAN SECTION      OB History    Gravida  4     Para  3   Term      Preterm      AB      Living  3     SAB      TAB      Ectopic      Multiple      Live Births               Home Medications    Prior to Admission medications   Medication Sig Start Date End Date Taking? Authorizing Provider  albuterol (PROVENTIL HFA;VENTOLIN HFA) 108 (90 Base) MCG/ACT inhaler Inhale 1-2 puffs into the lungs every 6 (six) hours as needed for wheezing or shortness of breath. 01/07/19   Bast, Traci A, NP  benzonatate (TESSALON) 100 MG capsule Take 1 capsule (100 mg total) by mouth every 8 (eight) hours. 01/07/19   Loura Halt A, NP  Cholecalciferol (VITAMIN D) 2000 units tablet Take by mouth. 07/04/17   [provider]  cyclobenzaprine (FLEXERIL) 5 MG tablet Take 1 tablet (5 mg total) by mouth 3 (three) times daily as needed for muscle spasms. 02/15/18   Raylene Everts, MD  EPINEPHrine (EPIPEN 2-PAK) 0.3 mg/0.3 mL IJ SOAJ injection Inject  into the muscle. 11/01/17   [provider]  furosemide (LASIX) 20 MG tablet Take by mouth as needed.  12/22/17   [provider]  hydrOXYzine (ATARAX/VISTARIL) 25 MG tablet hydroxyzine HCl 25 mg tablet    [provider]  ibuprofen (ADVIL,MOTRIN) 800 MG tablet Take 1 tablet (800 mg total) by mouth every 8 (eight) hours as needed. 02/15/18   Raylene Everts, MD  ketoconazole (NIZORAL) 2 % cream Apply 1 application topically daily. 02/21/18   Trula Slade, DPM  LO LOESTRIN FE 1 MG-10 MCG / 10 MCG tablet Take 1 tablet by mouth daily. 04/15/20   [provider]  meloxicam (MOBIC) 15 MG tablet Take 1 tablet (15 mg total) by mouth daily. meloxicam 15 mg tablet 09/13/18   Landis Martins, DPM  NON FORMULARY [facility]  Onychomycosis Nail Lacquer -  Fluconazole 2%, Terbinafine 1% DMSO Apply to affected nail once daily Qty. 120 gm 3 refills    [provider]  NON FORMULARY [facility]  Onychomycosis Nail Lacquer -  Fluconazole 2%,  Terbinafine 1% DMSO Apply to affected nail once daily Qty. 120 gm 3 refills    [provider]  NON [place]  Urea Cream - 40% Urea Apply 1-2 grams to affected area 3-4 times daily Qty. 120 gm 3 refills    [provider]  NONFORMULARY OR COMPOUNDED [facility]:  Onychomycosis Nail Lacquer - Fluconazole 2%, Terbinafine 1%, DMSO apply to affected area daily. 02/15/18   Raylene Everts, MD  pantoprazole (PROTONIX) 20 MG tablet pantoprazole 20 mg tablet,delayed release    [provider]  phenazopyridine (PYRIDIUM) 100 MG tablet Take by mouth. 08/31/17   [provider]  potassium chloride (K-DUR,KLOR-CON) 10 MEQ tablet Take by mouth as needed.  12/22/17   [provider]  predniSONE (STERAPRED UNI-PAK 21 TAB) 10 MG (21) TBPK tablet Take by mouth daily. Take 6 tabs by mouth day 1, then 5 tabs, then 4 tabs, then 3 tabs, 2 tabs, then 1 tab for the last day 09/27/18   Ok Edwards, PA-C  Vitamin D, Ergocalciferol, (DRISDOL) 1.25 MG (50000 UNIT) CAPS capsule Take 50,000 Units by mouth once a week. 03/28/20   [provider]    Family History Family History  Problem Relation Age of Onset  . Emphysema Sister   . Stroke Maternal Grandmother   . Heart disease Maternal Grandmother   . Diabetes Father   . Diabetes Other   . Heart disease Maternal Grandfather   . Asthma Maternal Grandfather   . Breast cancer Neg Hx     Social History Social History   Tobacco Use  . Smoking status: Never Smoker  . Smokeless tobacco: Never Used  Vaping Use  . Vaping Use: Never used  Substance Use Topics  . Alcohol use: No  . Drug use: No     Allergies   Bee venom and Peanuts [peanut oil]   Review of Systems Review of Systems   Physical Exam Triage Vital Signs ED Triage Vitals  Enc Vitals Group     BP 05/08/20 0848 (!) 142/86     Pulse Rate 05/08/20 0848 79     Resp 05/08/20 0848 14     Temp 05/08/20 0848 98.6 F  (37 C)     Temp src --      SpO2 05/08/20 0848 98 %     Weight --      Height --  Head Circumference --      Peak Flow --      Pain Score 05/08/20 0846 0     Pain Loc --      Pain Edu? --      Excl. in [place]? --    No data found.  Updated Vital Signs BP (!) 142/86   Pulse 79   Temp 98.6 F (37 C)   Resp 14   LMP 10/06/2016 (Exact Date)   SpO2 98%   Visual Acuity Right Eye Distance:   Left Eye Distance:   Bilateral Distance:    Right Eye Near:   Left Eye Near:    Bilateral Near:     Physical Exam Vitals and nursing note reviewed.  Constitutional:      General: She is not in acute distress.    Appearance: She is well-developed.  HENT:     Head: Normocephalic and atraumatic.  Eyes:     Conjunctiva/sclera: Conjunctivae normal.  Cardiovascular:     Rate and Rhythm: Normal rate and regular rhythm.     Heart sounds: No murmur heard.   Pulmonary:     Effort: Pulmonary effort is normal. No respiratory distress.     Breath sounds: Normal breath sounds.  Abdominal:     Palpations: Abdomen is soft.     Tenderness: There is no abdominal tenderness.  Musculoskeletal:     Cervical back: Neck supple.     Comments: There is trace to 1+ edema in the right lower extremity.  No foot swelling  Left with 2+ edema starting in the foot to just below the knee, pitting.  No calf tenderness.  No popliteal tenderness or mass appreciated.  Left is significantly greater in diameter than the right  Skin:    General: Skin is warm and dry.  Neurological:     Mental Status: She is alert.      UC Treatments / Results  Labs (all labs ordered are listed, but only abnormal results are displayed) Labs Reviewed - No data to display  EKG   Radiology VAS [place] LOWER EXTREMITY VENOUS (DVT) (ONLY [facility] & [facility])  Result Date: 05/08/2020  Lower Venous DVTStudy Indications: Left foot swelling x approximately 5 days.  Limitations: Poor ultrasound/tissue interface and body habitus. Comparison  Study: 03/28/2019- negative lower extremity venous duplex Performing Technologist: Maudry Mayhew MHA, RDMS, RVT, RDCS  Examination Guidelines: A complete evaluation includes B-mode imaging, spectral Doppler, color Doppler, and power Doppler as needed of all accessible portions of each vessel. Bilateral testing is considered an integral part of a complete examination. Limited examinations for reoccurring indications may be performed as noted. The reflux portion of the exam is performed with the patient in reverse Trendelenburg.  +-----+---------------+---------+-----------+----------+--------------+ RIGHTCompressibilityPhasicitySpontaneityPropertiesThrombus Aging +-----+---------------+---------+-----------+----------+--------------+ CFV  Full           Yes      Yes                                 +-----+---------------+---------+-----------+----------+--------------+   +---------+---------------+---------+-----------+----------+--------------+ LEFT     CompressibilityPhasicitySpontaneityPropertiesThrombus Aging +---------+---------------+---------+-----------+----------+--------------+ CFV      Full           Yes      Yes                                 +---------+---------------+---------+-----------+----------+--------------+ SFJ      Full                                                        +---------+---------------+---------+-----------+----------+--------------+  FV Prox  Full                                                        +---------+---------------+---------+-----------+----------+--------------+ FV Mid   Full                                                        +---------+---------------+---------+-----------+----------+--------------+ FV DistalFull                                                        +---------+---------------+---------+-----------+----------+--------------+ PFV      Full                                                         +---------+---------------+---------+-----------+----------+--------------+ POP      Full           Yes      Yes                                 +---------+---------------+---------+-----------+----------+--------------+ PTV      Full                                                        +---------+---------------+---------+-----------+----------+--------------+ PERO     Full                                                        +---------+---------------+---------+-----------+----------+--------------+     Summary: RIGHT: - No evidence of common femoral vein obstruction.  LEFT: - There is no evidence of deep vein thrombosis in the lower extremity.  - No cystic structure found in the popliteal fossa.  *See table(s) above for measurements and observations.    Preliminary     Procedures Procedures (including critical care time)  Medications Ordered in UC Medications - No data to display  Initial Impression / Assessment and Plan / UC Course  I have reviewed the triage vital signs and the nursing notes.  Pertinent labs & imaging results that were available during my care of the patient were reviewed by me and considered in my medical decision making (see chart for details).     #Lower extremity edema Patient is a 59 year old with history of lower extremity edema presenting with left greater than right lower extremity edema.  Vascular ultrasound without evidence of DVT.  Recommend elevation and compression stocking.  Instructed patient follow-up with her primary care. -DVT study negative.  Acute on chronic lower extremity edema.  Discussed findings with patient on the phone.  She verbalized understanding plan of care. Final Clinical Impressions(s) / UC Diagnoses   Final diagnoses:  Lower extremity edema     Discharge Instructions     We have schedule an Ultrasound to be done at [facility] today at 11 AM. Please report there, I will call you once we have  the results.       ED Prescriptions    None     PDMP not reviewed this encounter.   Purnell Shoemaker, PA-C 05/08/20 2258

## 2020-05-08 NOTE — Progress Notes (Signed)
Left lower extremity venous duplex completed. Refer to "CV Proc" under chart review to view preliminary results.  05/08/2020 11:42 AM Kelby Aline., MHA, RVT, RDCS, RDMS

## 2020-05-26 DIAGNOSIS — Z1211 Encounter for screening for malignant neoplasm of colon: Secondary | ICD-10-CM | POA: Insufficient documentation

## 2020-05-26 DIAGNOSIS — Z86010 Personal history of colonic polyps: Secondary | ICD-10-CM | POA: Insufficient documentation

## 2020-08-07 ENCOUNTER — Other Ambulatory Visit: Payer: Self-pay

## 2020-08-07 ENCOUNTER — Encounter: Payer: Self-pay | Admitting: Sports Medicine

## 2020-08-07 ENCOUNTER — Ambulatory Visit: Payer: Federal, State, Local not specified - PPO | Admitting: Sports Medicine

## 2020-08-07 DIAGNOSIS — M79675 Pain in left toe(s): Secondary | ICD-10-CM | POA: Diagnosis not present

## 2020-08-07 DIAGNOSIS — B351 Tinea unguium: Secondary | ICD-10-CM

## 2020-08-07 DIAGNOSIS — B353 Tinea pedis: Secondary | ICD-10-CM

## 2020-08-07 DIAGNOSIS — M79674 Pain in right toe(s): Secondary | ICD-10-CM | POA: Diagnosis not present

## 2020-08-07 NOTE — Progress Notes (Signed)
Subjective: Taylor Reynolds is a 59 y.o. female patient who returns to office for follow-up evaluation of nail fungus and for nail trim.  Patient reports that she ran out of her topical treatment for her nails. No other issues noted.   Patient Active Problem List   Diagnosis Date Noted  . Polyp of colon 09/07/2019  . Leg edema 02/01/2019  . Acid reflux 03/08/2013  . Neck pain 04/05/2012  . Vitamin D deficiency disease 01/07/2012  . Obesity (BMI 35.0-39.9 without comorbidity) 01/07/2012  . Chest pain 02/12/2011  . Asthma 02/12/2011    Current Outpatient Medications on File Prior to Visit  Medication Sig Dispense Refill  . albuterol (PROVENTIL HFA;VENTOLIN HFA) 108 (90 Base) MCG/ACT inhaler Inhale 1-2 puffs into the lungs every 6 (six) hours as needed for wheezing or shortness of breath. 1 Inhaler 0  . benzonatate (TESSALON) 100 MG capsule Take 1 capsule (100 mg total) by mouth every 8 (eight) hours. 21 capsule 0  . Cholecalciferol (VITAMIN D) 2000 units tablet Take by mouth.    . cyclobenzaprine (FLEXERIL) 5 MG tablet Take 1 tablet (5 mg total) by mouth 3 (three) times daily as needed for muscle spasms. 30 tablet 0  . EPINEPHrine (EPIPEN 2-PAK) 0.3 mg/0.3 mL IJ SOAJ injection Inject into the muscle.    . furosemide (LASIX) 20 MG tablet Take by mouth as needed.     . hydrOXYzine (ATARAX/VISTARIL) 25 MG tablet hydroxyzine HCl 25 mg tablet    . ibuprofen (ADVIL,MOTRIN) 800 MG tablet Take 1 tablet (800 mg total) by mouth every 8 (eight) hours as needed. 30 tablet 0  . ketoconazole (NIZORAL) 2 % cream Apply 1 application topically daily. 60 g 2  . LO LOESTRIN FE 1 MG-10 MCG / 10 MCG tablet Take 1 tablet by mouth daily.    . meloxicam (MOBIC) 15 MG tablet Take 1 tablet (15 mg total) by mouth daily. meloxicam 15 mg tablet 30 tablet 0  . NON FORMULARY [facility]  Onychomycosis Nail Lacquer -  Fluconazole 2%, Terbinafine 1% DMSO Apply to affected nail once daily Qty. 120 gm 3  refills    . NON FORMULARY [facility]  Onychomycosis Nail Lacquer -  Fluconazole 2%, Terbinafine 1% DMSO Apply to affected nail once daily Qty. 120 gm 3 refills    . NON FORMULARY [facility]  Urea Cream - 40% Urea Apply 1-2 grams to affected area 3-4 times daily Qty. 120 gm 3 refills    . NONFORMULARY OR COMPOUNDED ITEM [facility]:  Onychomycosis Nail Lacquer - Fluconazole 2%, Terbinafine 1%, DMSO apply to affected area daily. 120 each 2  . pantoprazole (PROTONIX) 20 MG tablet pantoprazole 20 mg tablet,delayed release    . phenazopyridine (PYRIDIUM) 100 MG tablet Take by mouth.    . potassium chloride (K-DUR,KLOR-CON) 10 MEQ tablet Take by mouth as needed.     . predniSONE (STERAPRED UNI-PAK 21 TAB) 10 MG (21) TBPK tablet Take by mouth daily. Take 6 tabs by mouth day 1, then 5 tabs, then 4 tabs, then 3 tabs, 2 tabs, then 1 tab for the last day 21 tablet 0  . Vitamin D, Ergocalciferol, (DRISDOL) 1.25 MG (50000 UNIT) CAPS capsule Take 50,000 Units by mouth once a week.     No current facility-administered medications on file prior to visit.    Allergies  Allergen Reactions  . Bee Venom Anaphylaxis  . Peanuts [Peanut Oil] Anaphylaxis    Objective:  General: Alert and oriented x3 in no  acute distress  Dermatology: No open lesions bilateral lower extremities, no webspace macerations, no ecchymosis bilateral, all nails x 10 are elongated, thick and mycotic with the left 1st toenail most invovled like before.  Vascular: Dorsalis Pedis and Posterior Tibial pedal pulses palpable, Capillary Fill Time 3 seconds,(+) pedal hair growth bilateral, trace edema bilateral lower extremities, Temperature gradient within normal limits.  Neurology: Johney Maine sensation intact via light touch bilateral.  Musculoskeletal: No symptomatic pedal complaints noted at this visit. Strength within normal limits in all groups bilateral.   Assessment and Plan: Problem List Items Addressed This  Visit    None    Visit Diagnoses    Onychomycosis    -  Primary   Tinea pedis of both feet       Toe pain, bilateral          -Complete examination performed -Mechanically debrided all nails using sterile nipper without incident and filed nails with dremel without incident  -Advised patient to call [facility] for refill on topical antifungal -Patient to return to office in 3 months or sooner if condition worsens.  Landis Martins, DPM

## 2020-08-07 NOTE — Patient Instructions (Signed)
[facility] in [place] Address: [address], [place], [place] [address] Hours:  Open ? Closes 4PM Phone: [phone]

## 2020-10-15 ENCOUNTER — Other Ambulatory Visit: Payer: Self-pay | Admitting: Obstetrics and Gynecology

## 2020-10-15 DIAGNOSIS — Z1231 Encounter for screening mammogram for malignant neoplasm of breast: Secondary | ICD-10-CM

## 2020-11-13 ENCOUNTER — Encounter: Payer: Self-pay | Admitting: Sports Medicine

## 2020-11-13 ENCOUNTER — Other Ambulatory Visit: Payer: Self-pay

## 2020-11-13 ENCOUNTER — Ambulatory Visit: Payer: Federal, State, Local not specified - PPO | Admitting: Sports Medicine

## 2020-11-13 DIAGNOSIS — M79674 Pain in right toe(s): Secondary | ICD-10-CM | POA: Diagnosis not present

## 2020-11-13 DIAGNOSIS — B351 Tinea unguium: Secondary | ICD-10-CM

## 2020-11-13 DIAGNOSIS — M79675 Pain in left toe(s): Secondary | ICD-10-CM | POA: Diagnosis not present

## 2020-11-13 DIAGNOSIS — B353 Tinea pedis: Secondary | ICD-10-CM

## 2020-11-13 DIAGNOSIS — Z79899 Other long term (current) drug therapy: Secondary | ICD-10-CM | POA: Diagnosis not present

## 2020-11-13 MED ORDER — NON FORMULARY: Freq: Two times a day (BID) | Status: DC

## 2020-11-13 NOTE — Patient Instructions (Signed)
Vinegar soaks 1 cup of white distilled vinegar to 8 cups of warm water.  Soak 20 mins. May repeat soak two times per week.  If there is thickness to nails may file nails after soaks or after bath/shower with nail file and apply tea tree oil or vicks vapor rub. Apply daily to nails after filing for the best result.

## 2020-11-13 NOTE — Progress Notes (Signed)
Subjective: Taylor Reynolds is a 60 y.o. female patient who returns to office for follow-up evaluation of nail fungus and for nail trim.  Patient reports that she is out of her topical and has dry skin. No other issues noted.   Patient Active Problem List   Diagnosis Date Noted  . Colon cancer screening 05/26/2020  . History of colonic polyps 05/26/2020  . Polyp of colon 09/07/2019  . Leg edema 02/01/2019  . Acid reflux 03/08/2013  . Neck pain 04/05/2012  . Vitamin D deficiency disease 01/07/2012  . Obesity (BMI 35.0-39.9 without comorbidity) 01/07/2012  . Chest pain 02/12/2011  . Asthma 02/12/2011    Current Outpatient Medications on File Prior to Visit  Medication Sig Dispense Refill  . albuterol (PROVENTIL HFA;VENTOLIN HFA) 108 (90 Base) MCG/ACT inhaler Inhale 1-2 puffs into the lungs every 6 (six) hours as needed for wheezing or shortness of breath. 1 Inhaler 0  . benzonatate (TESSALON) 100 MG capsule Take 1 capsule (100 mg total) by mouth every 8 (eight) hours. 21 capsule 0  . Cholecalciferol (VITAMIN D) 2000 units tablet Take by mouth.    . cyclobenzaprine (FLEXERIL) 5 MG tablet Take 1 tablet (5 mg total) by mouth 3 (three) times daily as needed for muscle spasms. 30 tablet 0  . EPINEPHrine 0.3 mg/0.3 mL IJ SOAJ injection Inject into the muscle.    . furosemide (LASIX) 20 MG tablet Take by mouth as needed.     . hydrOXYzine (ATARAX/VISTARIL) 25 MG tablet hydroxyzine HCl 25 mg tablet    . ibuprofen (ADVIL,MOTRIN) 800 MG tablet Take 1 tablet (800 mg total) by mouth every 8 (eight) hours as needed. 30 tablet 0  . ketoconazole (NIZORAL) 2 % cream Apply 1 application topically daily. 60 g 2  . LO LOESTRIN FE 1 MG-10 MCG / 10 MCG tablet Take 1 tablet by mouth daily.    . meloxicam (MOBIC) 15 MG tablet Take 1 tablet (15 mg total) by mouth daily. meloxicam 15 mg tablet 30 tablet 0  . NON FORMULARY [facility]  Onychomycosis Nail Lacquer -  Fluconazole 2%, Terbinafine 1%  DMSO Apply to affected nail once daily Qty. 120 gm 3 refills    . NON FORMULARY [facility]  Onychomycosis Nail Lacquer -  Fluconazole 2%, Terbinafine 1% DMSO Apply to affected nail once daily Qty. 120 gm 3 refills    . NON FORMULARY [facility]  Urea Cream - 40% Urea Apply 1-2 grams to affected area 3-4 times daily Qty. 120 gm 3 refills    . NONFORMULARY OR COMPOUNDED ITEM [facility]:  Onychomycosis Nail Lacquer - Fluconazole 2%, Terbinafine 1%, DMSO apply to affected area daily. 120 each 2  . pantoprazole (PROTONIX) 20 MG tablet pantoprazole 20 mg tablet,delayed release    . phenazopyridine (PYRIDIUM) 100 MG tablet Take by mouth.    . potassium chloride (K-DUR,KLOR-CON) 10 MEQ tablet Take by mouth as needed.     . predniSONE (STERAPRED UNI-PAK 21 TAB) 10 MG (21) TBPK tablet Take by mouth daily. Take 6 tabs by mouth day 1, then 5 tabs, then 4 tabs, then 3 tabs, 2 tabs, then 1 tab for the last day 21 tablet 0  . Vitamin D, Ergocalciferol, (DRISDOL) 1.25 MG (50000 UNIT) CAPS capsule Take 50,000 Units by mouth once a week.     No current facility-administered medications on file prior to visit.    Allergies  Allergen Reactions  . Bee Venom Anaphylaxis  . Peanuts [Peanut Oil] Anaphylaxis  Objective:  General: Alert and oriented x3 in no acute distress  Dermatology: No open lesions bilateral lower extremities, no webspace macerations, no ecchymosis bilateral, all nails x 10 are elongated, thick and mycotic with the left 1st toenail most invovled like before.  Vascular: Dorsalis Pedis and Posterior Tibial pedal pulses palpable, Capillary Fill Time 3 seconds,(+) pedal hair growth bilateral, trace edema bilateral lower extremities, Temperature gradient within normal limits.  Neurology: Johney Maine sensation intact via light touch bilateral.  Musculoskeletal: No symptomatic pedal complaints noted at this visit. Strength within normal limits in all groups bilateral.    Assessment and Plan: Problem List Items Addressed This Visit   None   Visit Diagnoses    Onychomycosis    -  Primary   Tinea pedis of both feet       Toe pain, bilateral       Long-term use of high-risk medication          -Complete examination performed -Mechanically debrided all nails using sterile nipper without incident and filed nails with dremel without incident  -Recommend daily skin emollients; sample of foot miracle provided and recommend soaking with vinegar and tea tree oil or vicks if she can not get topical compound from [place] as Rx'd at todays visit -Patient to return to office in 3 months or sooner if condition worsens.  Landis Martins, DPM

## 2020-11-27 ENCOUNTER — Other Ambulatory Visit: Payer: Self-pay

## 2020-11-27 ENCOUNTER — Ambulatory Visit
Admission: RE | Admit: 2020-11-27 | Discharge: 2020-11-27 | Disposition: A | Discharge status: Home / Self Care | Payer: Federal, State, Local not specified - PPO | Source: Ambulatory Visit | Attending: Obstetrics and Gynecology | Admitting: Obstetrics and Gynecology

## 2020-11-27 DIAGNOSIS — Z1231 Encounter for screening mammogram for malignant neoplasm of breast: Secondary | ICD-10-CM

## 2020-12-23 ENCOUNTER — Other Ambulatory Visit: Payer: Self-pay

## 2020-12-23 ENCOUNTER — Encounter (HOSPITAL_BASED_OUTPATIENT_CLINIC_OR_DEPARTMENT_OTHER): Payer: Self-pay | Admitting: Emergency Medicine

## 2020-12-23 ENCOUNTER — Emergency Department (HOSPITAL_BASED_OUTPATIENT_CLINIC_OR_DEPARTMENT_OTHER): Payer: Federal, State, Local not specified - PPO

## 2020-12-23 ENCOUNTER — Emergency Department (HOSPITAL_BASED_OUTPATIENT_CLINIC_OR_DEPARTMENT_OTHER)
Admission: EM | Admit: 2020-12-23 | Discharge: 2020-12-24 | Disposition: A | Discharge status: Home / Self Care | Payer: Federal, State, Local not specified - PPO | Attending: Emergency Medicine | Admitting: Emergency Medicine

## 2020-12-23 DIAGNOSIS — R1033 Periumbilical pain: Secondary | ICD-10-CM | POA: Diagnosis present

## 2020-12-23 DIAGNOSIS — J45909 Unspecified asthma, uncomplicated: Secondary | ICD-10-CM | POA: Diagnosis not present

## 2020-12-23 DIAGNOSIS — Z91010 Allergy to peanuts: Secondary | ICD-10-CM | POA: Insufficient documentation

## 2020-12-23 DIAGNOSIS — K429 Umbilical hernia without obstruction or gangrene: Secondary | ICD-10-CM | POA: Insufficient documentation

## 2020-12-23 LAB — COMPREHENSIVE METABOLIC PANEL
ALT: 18 U/L (ref 0–44)
AST: 18 U/L (ref 15–41)
Albumin: 3.4 g/dL — ABNORMAL LOW (ref 3.5–5.0)
Alkaline Phosphatase: 54 U/L (ref 38–126)
Anion gap: 7 (ref 5–15)
BUN: 11 mg/dL (ref 6–20)
CO2: 25 mmol/L (ref 22–32)
Calcium: 8.7 mg/dL — ABNORMAL LOW (ref 8.9–10.3)
Chloride: 104 mmol/L (ref 98–111)
Creatinine, Ser: 0.84 mg/dL (ref 0.44–1.00)
GFR, Estimated: >60 mL/min (ref >60)
Glucose, Bld: 105 mg/dL — ABNORMAL HIGH (ref 70–99)
Potassium: 3.7 mmol/L (ref 3.5–5.1)
Sodium: 136 mmol/L (ref 135–145)
Total Bilirubin: 0.5 mg/dL (ref 0.3–1.2)
Total Protein: 6.9 g/dL (ref 6.5–8.1)

## 2020-12-23 LAB — CBC WITH DIFFERENTIAL/PLATELET
Abs Immature Granulocytes: 0.01 10*3/uL (ref 0.00–0.07)
Basophils Absolute: 0 10*3/uL (ref 0.0–0.1)
Basophils Relative: 1 %
Eosinophils Absolute: 0.2 10*3/uL (ref 0.0–0.5)
Eosinophils Relative: 5 %
HCT: 39.3 % (ref 36.0–46.0)
Hemoglobin: 12.9 g/dL (ref 12.0–15.0)
Immature Granulocytes: 0 %
Lymphocytes Relative: 43 %
Lymphs Abs: 1.7 10*3/uL (ref 0.7–4.0)
MCH: 29 pg (ref 26.0–34.0)
MCHC: 32.8 g/dL (ref 30.0–36.0)
MCV: 88.3 fL (ref 80.0–100.0)
Monocytes Absolute: 0.6 10*3/uL (ref 0.1–1.0)
Monocytes Relative: 15 %
Neutro Abs: 1.4 10*3/uL — ABNORMAL LOW (ref 1.7–7.7)
Neutrophils Relative %: 36 %
Platelets: 207 10*3/uL (ref 150–400)
RBC: 4.45 MIL/uL (ref 3.87–5.11)
RDW: 14.6 % (ref 11.5–15.5)
WBC: 4 10*3/uL (ref 4.0–10.5)
nRBC: 0 % (ref 0.0–0.2)

## 2020-12-23 LAB — LIPASE, BLOOD: Lipase: 32 U/L (ref 11–51)

## 2020-12-23 MED ORDER — IOHEXOL 300 MG/ML  SOLN
100.0000 mL | Freq: Once | INTRAMUSCULAR | Status: AC | PRN
  Administered 2020-12-23: 100 mL via INTRAVENOUS

## 2020-12-23 NOTE — ED Provider Notes (Signed)
[facility] EMERGENCY DEPARTMENT Provider Note  CSN: [id number] Arrival date & time: 12/23/20 2051    History Chief Complaint  Patient presents with  . Abdominal Pain    HPI  Taylor Reynolds is a 60 y.o. female reports she was in her car on the way to work when she had sudden onset of moderate aching pain in her umbilical region and felt a hard knot there. She has no history of same. Was not coughing or sneezing when pain started. Has not had nausea or vomiting. Has been in her normal state of health recently.    Past Medical History:  Diagnosis Date  . Abnormal Pap smear 06/1989  . Anal itching 05/2009  . ASCUS (atypical squamous cells of undetermined significance) on Pap smear 2006  . Asthma   . 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 Chest pressure   . H/O pelvic mass 2003  . History of irregular menstrual bleeding 02/2011  . History of measles, mumps, or rubella   . Leg edema 02/01/2019  . Ovarian cyst 01/2002  . Pelvic pain 12/2005  . Reflux   . Strain of shoulder, left 04/2006  . Urge incontinence 2001  . Varicella     Past Surgical History:  Procedure Laterality Date  . ACNE CYST REMOVAL     Under eye  . CESAREAN SECTION      Family History  Problem Relation Age of Onset  . Emphysema Sister   . Stroke Maternal Grandmother   . Heart disease Maternal Grandmother   . Diabetes Father   . Diabetes Other   . Heart disease Maternal Grandfather   . Asthma Maternal Grandfather   . Breast cancer Neg Hx     Social History   Tobacco Use  . Smoking status: Never Smoker  . Smokeless tobacco: Never Used  Vaping Use  . Vaping Use: Never used  Substance Use Topics  . Alcohol use: No  . Drug use: No     Home Medications Prior to Admission medications   Medication Sig Start Date End Date Taking? Authorizing Provider  albuterol (PROVENTIL HFA;VENTOLIN HFA) 108 (90 Base) MCG/ACT inhaler Inhale 1-2 puffs into the lungs every 6 (six) hours as needed for  wheezing or shortness of breath. 01/07/19   Bast, Traci A, NP  benzonatate (TESSALON) 100 MG capsule Take 1 capsule (100 mg total) by mouth every 8 (eight) hours. 01/07/19   Loura Halt A, NP  Cholecalciferol (VITAMIN D) 2000 units tablet Take by mouth. 07/04/17   [provider]  cyclobenzaprine (FLEXERIL) 5 MG tablet Take 1 tablet (5 mg total) by mouth 3 (three) times daily as needed for muscle spasms. 02/15/18   Raylene Everts, MD  EPINEPHrine 0.3 mg/0.3 mL IJ SOAJ injection Inject into the muscle. 11/01/17   [provider]  furosemide (LASIX) 20 MG tablet Take by mouth as needed.  12/22/17   [provider]  hydrOXYzine (ATARAX/VISTARIL) 25 MG tablet hydroxyzine HCl 25 mg tablet    [provider]  ibuprofen (ADVIL,MOTRIN) 800 MG tablet Take 1 tablet (800 mg total) by mouth every 8 (eight) hours as needed. 02/15/18   Raylene Everts, MD  ketoconazole (NIZORAL) 2 % cream Apply 1 application topically daily. 02/21/18   Trula Slade, DPM  LO LOESTRIN FE 1 MG-10 MCG / 10 MCG tablet Take 1 tablet by mouth daily. 04/15/20   [provider]  meloxicam (MOBIC) 15 MG tablet Take 1 tablet (15 mg total)  by mouth daily. meloxicam 15 mg tablet 09/13/18   Landis Martins, DPM  NON FORMULARY [facility]  Onychomycosis Nail Lacquer -  Fluconazole 2%, Terbinafine 1% DMSO Apply to affected nail once daily Qty. 120 gm 3 refills    [provider]  NON FORMULARY [facility]  Onychomycosis Nail Lacquer -  Fluconazole 2%, Terbinafine 1% DMSO Apply to affected nail once daily Qty. 120 gm 3 refills    [provider]  NON [facility]  Urea Cream - 40% Urea Apply 1-2 grams to affected area 3-4 times daily Qty. 120 gm 3 refills    [provider]  NONFORMULARY OR COMPOUNDED [place]:  Onychomycosis Nail Lacquer - Fluconazole 2%, Terbinafine 1%, DMSO apply to affected area daily. 02/15/18   Raylene Everts, MD  pantoprazole (PROTONIX) 20 MG tablet pantoprazole 20 mg tablet,delayed release    [provider]  phenazopyridine (PYRIDIUM) 100 MG tablet Take by mouth. 08/31/17   [provider]  potassium chloride (K-DUR,KLOR-CON) 10 MEQ tablet Take by mouth as needed.  12/22/17   [provider]  predniSONE (STERAPRED UNI-PAK 21 TAB) 10 MG (21) TBPK tablet Take by mouth daily. Take 6 tabs by mouth day 1, then 5 tabs, then 4 tabs, then 3 tabs, 2 tabs, then 1 tab for the last day 09/27/18   Ok Edwards, PA-C  Vitamin D, Ergocalciferol, (DRISDOL) 1.25 MG (50000 UNIT) CAPS capsule Take 50,000 Units by mouth once a week. 03/28/20   [provider]     Allergies    Bee venom and Peanuts [peanut oil]   Review of Systems   Review of Systems A comprehensive review of systems was completed and negative except as noted in HPI.    Physical Exam BP (!) 123/55 (BP Location: Right Arm)   Pulse 66   Temp 98 F (36.7 C) (Oral)   Resp 20   Ht 5\' 5"  (1.651 m)   Wt 113.4 kg   LMP 10/06/2016 (Exact Date)   SpO2 100%   BMI 41.60 kg/m   Physical Exam Vitals and nursing note reviewed.  Constitutional:      Appearance: Normal appearance.  HENT:     Head: Normocephalic and atraumatic.     Nose: Nose normal.     Mouth/Throat:     Mouth: Mucous membranes are moist.  Eyes:     Extraocular Movements: Extraocular movements intact.     Conjunctiva/sclera: Conjunctivae normal.  Cardiovascular:     Rate and Rhythm: Normal rate.  Pulmonary:     Effort: Pulmonary effort is normal.     Breath sounds: Normal breath sounds.  Abdominal:     General: Abdomen is flat.     Palpations: Abdomen is soft.     Tenderness: There is abdominal tenderness in the periumbilical area.     Hernia: A hernia (partially reduced) is present. Hernia is present in the umbilical area.  Musculoskeletal:        General: No swelling. Normal range of motion.     Cervical back: Neck supple.   Skin:    General: Skin is warm and dry.  Neurological:     General: No focal deficit present.     Mental Status: She is alert.  Psychiatric:        Mood and Affect: Mood normal.      ED Results / Procedures / Treatments   Labs (all labs ordered are listed, but only abnormal results are displayed) Labs  Reviewed  COMPREHENSIVE METABOLIC PANEL - Abnormal; Notable for the following components:      Result Value   Glucose, Bld 105 (*)    Calcium 8.7 (*)    Albumin 3.4 (*)    All other components within normal limits  CBC WITH DIFFERENTIAL/PLATELET - Abnormal; Notable for the following components:   Neutro Abs 1.4 (*)    All other components within normal limits  LIPASE, BLOOD    EKG None   Radiology No results found.  Procedures Procedures  Medications Ordered in the ED Medications  iohexol (OMNIPAQUE) 300 MG/ML solution 100 mL (100 mLs Intravenous Contrast Given 12/23/20 2324)     MDM Rules/Calculators/A&P MDM  Patient with new umbilical hernia, partially reduced but still tender. Will check labs and send for CT to evaluate for signs of incarceration or obstruction.   ED Course  I have reviewed the triage vital signs and the nursing notes.  Pertinent labs & imaging results that were available during my care of the patient were reviewed by me and considered in my medical decision making (see chart for details).  Clinical Course as of 12/23/20 2340  Tue Dec 23, 2020  2300 CBC is normal.  [CS]  2319 CMP and Lipase are neg.  [CS]  2339 Care of the patient signed out to Dr. Cassandria Anger at the change of shift pending CT results.  [CS]    Clinical Course User Index [CS] Truddie Hidden, MD    Final Clinical Impression(s) / ED Diagnoses Final diagnoses:  Umbilical hernia without obstruction and without gangrene    Rx / DC Orders ED Discharge Orders    None       Truddie Hidden, MD 12/23/20 2340

## 2020-12-23 NOTE — ED Triage Notes (Signed)
Pt pain in navel area started about 1 hour ago, feels hard and like gas.

## 2021-01-20 ENCOUNTER — Ambulatory Visit: Payer: Self-pay | Admitting: Surgery

## 2021-01-20 NOTE — H&P (Signed)
Taylor Reynolds Appointment: 01/20/2021 4:00 PM Location: [facility] Patient #: [id number] DOB: 06/30/1961 Divorced / Language: Taylor Reynolds / Race: Black or African American Female  History of Present Illness Taylor Reynolds A. Taylor Boisselle MD; 01/20/2021 4:32 PM) Patient words: Patient presents for evaluation of umbilical hernia. She was seen in the emergency room about a month ago with periumbilical pain and abdominal pain. Computed tomography scan performed showed 8 mm umbilical hernia with a small piece of preperitoneal fat within it. Her pain is resolved. She presents today to discuss surgical repair of her umbilical hernia. No change in bowel or bladder function recently.  The patient is a 60 year old female.   Past Surgical History Taylor Reynolds, Taylor Reynolds; 01/20/2021 4:06 PM) Cesarean Section - 1 Colon Polyp Removal - Colonoscopy Oral Surgery  Diagnostic Studies History Taylor Reynolds; 01/20/2021 4:06 PM) Colonoscopy 1-5 years ago Mammogram within last year Pap Smear 1-5 years ago  Allergies Taylor Reynolds; 01/20/2021 4:06 PM) No Known Drug Allergies [01/20/2021]: Allergies Reconciled  Medication History Taylor Reynolds; 01/20/2021 4:06 PM) Albuterol Sulfate HFA (108 (90 Base)MCG/ACT Aerosol Soln, Inhalation) Active. Lo Loestrin Fe (1 MG-10 MCG /10 MCG Tablet, Oral) Active. Vitamin D (Ergocalciferol) (1.25 MG(50000 UT) Capsule, Oral) Active. Medications Reconciled  Social History Taylor Reynolds; 01/20/2021 4:06 PM) Caffeine use Tea.  Family History Taylor Reynolds, Taylor Reynolds; 01/20/2021 4:06 PM) Heart Disease Taylor Reynolds. Heart disease in female family member before age 32  Pregnancy / Birth History Taylor Reynolds, Taylor Reynolds; 01/20/2021 4:06 PM) Age at menarche 41 years. Gravida 4 Irregular periods Length (months) of breastfeeding 3-6 Maternal age 66-20 Para 3  Other Problems Taylor Reynolds; 01/20/2021 4:06 PM) Asthma Gastroesophageal Reflux Disease Umbilical Hernia  Repair     Review of Systems Taylor Reynolds Reynolds; 01/20/2021 4:06 PM) General Not Present- Appetite Loss, Chills, Fatigue, Fever, Night Sweats, Weight Gain and Weight Loss. Skin Not Present- Change in Wart/Mole, Dryness, Hives, Jaundice, New Lesions, Non-Healing Wounds, Rash and Ulcer. HEENT Not Present- Earache, Hearing Loss, Hoarseness, Nose Bleed, Oral Ulcers, Ringing in the Ears, Seasonal Allergies, Sinus Pain, Sore Throat, Visual Disturbances, Wears glasses/contact lenses and Yellow Eyes. Respiratory Not Present- Bloody sputum, Chronic Cough, Difficulty Breathing, Snoring and Wheezing. Breast Not Present- Breast Mass, Breast Pain, Nipple Discharge and Skin Changes. Cardiovascular Present- Swelling of Extremities. Not Present- Chest Pain, Difficulty Breathing Lying Down, Leg Cramps, Palpitations, Rapid Heart Rate and Shortness of Breath. Gastrointestinal Not Present- Abdominal Pain, Bloating, Bloody Stool, Change in Bowel Habits, Chronic diarrhea, Constipation, Difficulty Swallowing, Excessive gas, Gets full quickly at meals, Hemorrhoids, Indigestion, Nausea, Rectal Pain and Vomiting. Female Genitourinary Not Present- Frequency, Nocturia, Painful Urination, Pelvic Pain and Urgency. Musculoskeletal Not Present- Back Pain, Joint Pain, Joint Stiffness, Muscle Pain, Muscle Weakness and Swelling of Extremities. Neurological Not Present- Decreased Memory, Fainting, Headaches, Numbness, Seizures, Tingling, Tremor, Trouble walking and Weakness. Psychiatric Not Present- Anxiety, Bipolar, Change in Sleep Pattern, Depression, Fearful and Frequent crying. Endocrine Not Present- Cold Intolerance, Excessive Hunger, Hair Changes, Heat Intolerance, Hot flashes and New Diabetes. Hematology Not Present- Blood Thinners, Easy Bruising, Excessive bleeding, Gland problems, HIV and Persistent Infections.  Vitals Taylor Reynolds Reynolds; 01/20/2021 4:07 PM) 01/20/2021 4:06 PM Weight: 251 lb Height: 65in Body Surface Area:  2.18 m Body Mass Index: 41.77 kg/m  Temp.: 97.3F  Pulse: 100 (Regular)  P.OX: 97% (Room air) BP: 160/100(Sitting, Left Arm, Standard)        Physical Exam (Taylor Reynolds A. Taylor Krotzer MD; 01/20/2021 4:33 PM)  Head and Neck  Head-normocephalic, atraumatic with no lesions or palpable masses. Trachea-midline. Thyroid Gland Characteristics - normal size and consistency.  Cardiovascular Cardiovascular examination reveals -normal heart sounds, regular rate and rhythm with no murmurs and normal pedal pulses bilaterally.  Abdomen Note: Soft nontender without rebound guarding. Small reducible umbilical hernia  Neurologic Neurologic evaluation reveals -alert and oriented x 3 with no impairment of recent or remote memory. Mental Status-Normal.  Lymphatic Head & Neck  General Head & Neck Lymphatics: Bilateral - Description - Normal. Axillary  General Axillary Region: Bilateral - Description - Normal. Tenderness - Non Tender.    Assessment & Plan (Taylor Reynolds A. Taylor Karg MD; 10/23/6061 0:16 PM)  UMBILICAL HERNIA WITHOUT OBSTRUCTION AND WITHOUT GANGRENE (K42.9) Impression: Reducible umbilical hernia. Discussed repair with mesh. It quite small and therefore may not require mesh which we discussed today as well. Risks and benefits of surgery discussed. Long-term expectations and expected recovery discussed. The risk of hernia repair include bleeding, infection, organ injury, bowel injury, bladder injury, nerve injury recurrent hernia, blood clots, worsening of underlying condition, chronic pain, mesh use, open surgery, death, and the need for other operations. Pt agrees to proceed  Current Plans You are being scheduled for surgery- Our schedulers will call you.  You should hear from our office's scheduling department within 5 working days about the location, date, and time of surgery. We try to make accommodations for patient's preferences in scheduling surgery, but sometimes the OR  schedule or the surgeon's schedule prevents [place] from making those accommodations.  If you have not heard from our office [phone]) in 5 working days, call the office and ask for your surgeon's nurse.  If you have other questions about your diagnosis, plan, or surgery, call the office and ask for your surgeon's nurse.  Pt Education - Pamphlet Given - Hernia Surgery: discussed with patient and provided information. Pt Education - CCS Mesh education: discussed with patient and provided information. CCS Consent - Hernia Repair - Ventral/Incisional/Umbilical (Gross): discussed with patient and provided information.

## 2021-02-11 ENCOUNTER — Encounter: Payer: Self-pay | Admitting: Student

## 2021-02-11 ENCOUNTER — Ambulatory Visit: Payer: Federal, State, Local not specified - PPO | Admitting: Student

## 2021-02-11 ENCOUNTER — Other Ambulatory Visit: Payer: Self-pay

## 2021-02-11 VITALS — BP 146/74 | HR 76 | Temp 98.1°F | Ht 65.0 in | Wt 250.0 lb

## 2021-02-11 DIAGNOSIS — R0609 Other forms of dyspnea: Secondary | ICD-10-CM

## 2021-02-11 DIAGNOSIS — R0600 Dyspnea, unspecified: Secondary | ICD-10-CM

## 2021-02-11 DIAGNOSIS — R600 Localized edema: Secondary | ICD-10-CM

## 2021-02-11 DIAGNOSIS — Z01818 Encounter for other preprocedural examination: Secondary | ICD-10-CM

## 2021-02-11 NOTE — Progress Notes (Signed)
Virtual Visit via Video Note   Subjective:   Taylor Reynolds, female    DOB: April 03, 1961, 60 y.o.   MRN: [id number]  Chief complaint:  Preoperative risk evaluation   HPI  60 year old American female with morbid obesity, asthma, family history of coronary artery disease, seen for atypical chest pain and leg edema.  Patient was last seen in our office 04/24/2019 by Dr. Virgina Jock for follow-up of leg edema which was found to be likely due to venous insufficiency and had resolved with compression stockings.  Patient is now referred back to our office for operative cardiovascular risk stratification prior to scheduled umbilical hernia repair.  Patient is relatively inactive, without formal exercise routine.  She does walk throughout the day at work as she works at the [employer].  However she states dyspnea on exertion, particularly when walking up an incline at work.  She continues to have bilateral lower leg edema, which she reports has increased over the last 1 year.  Notably she has not consistently wearing compression stockings.  Patient's blood pressure was recently elevated in visit to [facility] surgery, and is again elevated in our office at this time.  She is never been on antihypertensive medications before.  Review of external records reveals since last visit in our office patient has developed prediabetes as well as hyperlipidemia.  Patient denies chest pain, palpitations, syncope, near syncope, dizziness, orthopnea.  Past Medical History:  Diagnosis Date  . Abnormal Pap smear 06/1989  . Anal itching 05/2009  . ASCUS (atypical squamous cells of undetermined significance) on Pap smear 2006  . Asthma   . 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 Chest pressure   . H/O pelvic mass 2003  . History of irregular menstrual bleeding 02/2011  . History of measles, mumps, or rubella   . Leg edema 02/01/2019  . Ovarian cyst 01/2002  . Pelvic pain 12/2005  . Reflux   . Strain of  shoulder, left 04/2006  . Urge incontinence 2001  . Varicella    Past Surgical History:  Procedure Laterality Date  . ACNE CYST REMOVAL     Under eye  . CESAREAN SECTION     Social History   Tobacco Use  . Smoking status: Never Smoker  . Smokeless tobacco: Never Used  Substance Use Topics  . Alcohol use: No  Marital Status: Divorced  Family History  Problem Relation Age of Onset  . Emphysema Sister   . Stroke Maternal Grandmother   . Heart disease Maternal Grandmother   . Diabetes Father   . Diabetes Other   . Heart disease Maternal Grandfather   . Asthma Maternal Grandfather   . Breast cancer Neg Hx    Current Outpatient Medications  Medication Instructions  . albuterol (PROVENTIL HFA;VENTOLIN HFA) 108 (90 Base) MCG/ACT inhaler 1-2 puffs, Inhalation, Every 6 hours PRN  . EPINEPHrine 0.3 mg/0.3 mL IJ SOAJ injection Intramuscular   Allergies  Allergen Reactions  . Bee Venom Anaphylaxis  . Peanut Allergen Powder-Dnfp Anaphylaxis  . Peanuts [Peanut Oil] Anaphylaxis    Cardiovascular studies:  EKG 02/11/2021: Sinus rhythm at a rate of 75 bpm.  Normal axis.  Incomplete right bundle branch block.   Lower Extremity Venous Duplex left leg 03/28/2019: No evidence of deep vein thrombosis of the left lower extremity with normal venous return. Tissue edema noted.   Echocardiogram 10/16/2018 1. Left ventricle cavity is normal in size. Mild concentric hypertrophy of the left ventricle. Normal global wall motion. Normal diastolic filling  pattern. Calculated EF 65%. 2. Left atrial cavity is borderline dilated. 3. Mild (Grade I) mitral regurgitation. 4. Mild to moderate tricuspid regurgitation. No evidence of pulmonary hypertension.  Treadmill exercise stress test 10/13/2018: Indication: CP  The patient exercised on Bruce protocol for  7:00 min. Patient achieved  7.05 METS and reached HR  174 bpm, which is   106% of maximum age-predicted HR.  Stress test terminated due to  Fatigue. Resting EKG demonstrates NSR, IRBBB. ST Changes: With peak exercise there was no ST-T changes of ischemia.   Arrhythmias: Occasional PVC . Chest Pain: none.  BP Response to Exercise: Resting hypertension 172/86- exaggerated response 252/56 mm Hg. HR Response to Exercise: Exaggerated HR response suggests decreased aerobic tolerence. Exercise capacity was below average for age. Recommendations: Continue primary/secondary prevention.  Recent labs: CMP Latest Ref Rng & Units 12/23/2020 09/08/2018 01/05/2018  Glucose 70 - 99 mg/dL 105(H) 89 -  BUN 6 - 20 mg/dL 11 10 -  Creatinine 0.44 - 1.00 mg/dL 0.84 0.90 -  Sodium 135 - 145 mmol/L 136 141 -  Potassium 3.5 - 5.1 mmol/L 3.7 4.0 -  Chloride 98 - 111 mmol/L 104 107 -  CO2 22 - 32 mmol/L 25 - -  Calcium 8.9 - 10.3 mg/dL 8.7(L) - -  Total Protein 6.5 - 8.1 g/dL 6.9 - 6.6  Total Bilirubin 0.3 - 1.2 mg/dL 0.5 - 0.3  Alkaline Phos 38 - 126 U/L 54 - -  AST 15 - 41 U/L 18 - 15  ALT 0 - 44 U/L 18 - 17   CBC Latest Ref Rng & Units 12/23/2020 09/08/2018 01/05/2018  WBC 4.0 - 10.5 K/uL 4.0 - 4.0  Hemoglobin 12.0 - 15.0 g/dL 12.9 13.6 12.1  Hematocrit 36.0 - 46.0 % 39.3 40.0 35.9  Platelets 150 - 400 K/uL 207 - 206   Lipid Panel  No results found for: CHOL, TRIG, HDL, CHOLHDL, VLDL, LDLCALC, LDLDIRECT HEMOGLOBIN A1C No results found for: HGBA1C, MPG TSH No results for input(s): TSH in the last 8760 hours.  External Labs:  09/23/2020:  A1c 6.0% Total cholesterol 216, triglycerides 52, HDL 95, LDL 112 Glucose 86, BUN 13, creatinine 0.86, GFR 75, sodium 143, potassium 4.2, alk phos 77, AST 16, ALT 16  02/20/2020:  Hemoglobin 13, hematocrit 38.7, MCV 86, platelet 228   Review of Systems  Constitutional: Negative for malaise/fatigue and weight gain.  Cardiovascular: Positive for dyspnea on exertion and leg swelling. Negative for chest pain, claudication, near-syncope, orthopnea, palpitations, paroxysmal nocturnal dyspnea and syncope.   Hematologic/Lymphatic: Does not bruise/bleed easily.  Gastrointestinal: Negative for melena.  Neurological: Negative for dizziness and weakness.          Objective:   Vitals:   02/11/21 0949  BP: (!) 146/74  Pulse: 76  Temp: 98.1 F (36.7 C)  Height: _0  (1.651 m)  Weight: 250 lb (113.4 kg)  SpO2: 99%  BMI (Calculated): 41.6    Physical Exam Vitals and nursing note reviewed.  Constitutional:      General: She is not in acute distress.    Appearance: She is well-developed. She is obese.  HENT:     Head: Normocephalic and atraumatic.  Cardiovascular:     Rate and Rhythm: Normal rate and regular rhythm.     Pulses: Intact distal pulses.          Carotid pulses are 2+ on the right side and 2+ on the left side.      Radial pulses are 2+ on  the right side and 2+ on the left side.       Dorsalis pedis pulses are 2+ on the right side and 2+ on the left side.       Posterior tibial pulses are 2+ on the right side and 2+ on the left side.     Heart sounds: S1 normal and S2 normal. No murmur heard. No gallop.      Comments: Femoral and popliteal pulses difficult to evaluate due to patient body habitus. Pulmonary:     Effort: Pulmonary effort is normal. No respiratory distress.     Breath sounds: No wheezing, rhonchi or rales.  Abdominal:     General: Bowel sounds are normal.     Palpations: Abdomen is soft.  Musculoskeletal:     Right lower leg: Edema (minimal, non-pitting) present.     Left lower leg: Edema (minimal, non-pititng ) present.  Skin:    General: Skin is warm and dry.  Neurological:     Mental Status: She is alert and oriented to person, place, and time.       Assessment & Recommendations:     ICD-10-CM   1. Encounter for preoperative assessment  Z01.818 EKG 12-Lead    PCV ECHOCARDIOGRAM COMPLETE    PCV MYOCARDIAL PERFUSION WO LEXISCAN  2. Dyspnea on exertion  R06.00 PCV ECHOCARDIOGRAM COMPLETE    PCV MYOCARDIAL PERFUSION WO LEXISCAN  3. Bilateral  leg edema  R60.0 PCV ECHOCARDIOGRAM COMPLETE    PCV MYOCARDIAL PERFUSION WO LEXISCAN    60 year old American female with morbid obesity, asthma, family history of coronary artery disease, seen for atypical chest pain and leg edema.  Encounter for preoperative risk evaluation: Patient rightly scheduled to undergo umbilical hernia repair next week by provider at [facility] surgery.  However in view of dyspnea on exertion, worsening bilateral lower leg edema, and multiple cardiovascular risk factors shared decision was to proceed with further cardiac evaluation prior to undergoing hernia repair. Recommend nuclear stress test given dyspnea on exertion and 10-year ASCVD score of 10.7%. Recommend repeat echocardiogram given worsening leg swelling and dyspnea on exertion over the last 1 year. Advised patient proceeding with further cardiac evaluation may postpone her upcoming hernia repair, patient verbalizes understanding and prefers to reschedule hernia repair if needed. If stress test and echocardiogram are without significant abnormalities, patient may proceed with hernia repair with low risk of perioperative cardiovascular complications.  Dyspnea on exertion: Suspect dyspnea to be multifactorial including deconditioning.  However as patient is relatively inactive and has multiple cardiovascular risk factors would recommend proceeding with stress test and echocardiogram.  Bilateral lower leg edema: In the past patient has undergone reassuring cardiac work-up with negative DVT studies and echocardiogram with normal LVEF.  However in view of worsening bilateral edema for the last 1 year we will repeat echocardiogram. Advised patient to continue wearing support stockings on a regular basis and to elevate her feet when possible.  Prediabetes: Most recent A1c check was 6.0%, further review of external records reveals patient has had prediabetes over the last 1 year at least. Will defer further  management to PCP, however this does increase her cardiovascular risk.  Hypertension: Patient's blood pressure is uncontrolled in our office today.  She states she is seeing her PCP tomorrow.  Will defer further management to PCP. With patient regarding heart healthy low-sodium diet.  Hyperlipidemia: Last LDL check was elevated at 112.  Will defer further management to PCP at this time. Would recommend statin therapy in view  of 10-year ASCVD of 10.7% and prediabetes status.  Discussed at length regarding diet and lifestyle modifications in order to reduce cardiovascular risks as well as lose weight. Further recommendations regarding upcoming hernia repair pending cardiac testing results.   Follow up in 3 months, sooner if needed, for results of cardiac testing.    Alethia Berthold, PA-C 02/11/2021, 11:42 AM Office: [phone]

## 2021-02-11 NOTE — Patient Instructions (Signed)
Venous insufficieny

## 2021-02-12 ENCOUNTER — Ambulatory Visit: Payer: Federal, State, Local not specified - PPO | Admitting: Sports Medicine

## 2021-02-16 ENCOUNTER — Other Ambulatory Visit (HOSPITAL_COMMUNITY): Payer: Federal, State, Local not specified - PPO

## 2021-02-17 ENCOUNTER — Ambulatory Visit: Payer: Federal, State, Local not specified - PPO

## 2021-02-17 ENCOUNTER — Other Ambulatory Visit: Payer: Federal, State, Local not specified - PPO

## 2021-02-17 ENCOUNTER — Other Ambulatory Visit: Payer: Self-pay

## 2021-02-17 DIAGNOSIS — R0609 Other forms of dyspnea: Secondary | ICD-10-CM

## 2021-02-17 DIAGNOSIS — Z01818 Encounter for other preprocedural examination: Secondary | ICD-10-CM

## 2021-02-17 DIAGNOSIS — R030 Elevated blood-pressure reading, without diagnosis of hypertension: Secondary | ICD-10-CM | POA: Insufficient documentation

## 2021-02-17 DIAGNOSIS — R0600 Dyspnea, unspecified: Secondary | ICD-10-CM

## 2021-02-17 DIAGNOSIS — R600 Localized edema: Secondary | ICD-10-CM

## 2021-02-18 ENCOUNTER — Ambulatory Visit (HOSPITAL_BASED_OUTPATIENT_CLINIC_OR_DEPARTMENT_OTHER)
Admission: RE | Admit: 2021-02-18 | Payer: Federal, State, Local not specified - PPO | Source: Home / Self Care | Admitting: Surgery

## 2021-02-18 ENCOUNTER — Encounter (HOSPITAL_BASED_OUTPATIENT_CLINIC_OR_DEPARTMENT_OTHER): Admission: RE | Payer: Self-pay | Source: Home / Self Care

## 2021-02-18 SURGERY — REPAIR, HERNIA, UMBILICAL, ADULT
Anesthesia: General

## 2021-03-02 ENCOUNTER — Ambulatory Visit: Payer: Federal, State, Local not specified - PPO

## 2021-03-02 ENCOUNTER — Other Ambulatory Visit: Payer: Federal, State, Local not specified - PPO

## 2021-03-02 ENCOUNTER — Other Ambulatory Visit: Payer: Self-pay

## 2021-03-02 DIAGNOSIS — Z01818 Encounter for other preprocedural examination: Secondary | ICD-10-CM

## 2021-03-02 DIAGNOSIS — R0609 Other forms of dyspnea: Secondary | ICD-10-CM

## 2021-03-02 DIAGNOSIS — R600 Localized edema: Secondary | ICD-10-CM

## 2021-03-02 DIAGNOSIS — R0600 Dyspnea, unspecified: Secondary | ICD-10-CM

## 2021-03-17 DIAGNOSIS — I10 Essential (primary) hypertension: Secondary | ICD-10-CM | POA: Insufficient documentation

## 2021-03-19 ENCOUNTER — Encounter: Payer: Self-pay | Admitting: Sports Medicine

## 2021-03-19 ENCOUNTER — Other Ambulatory Visit: Payer: Self-pay

## 2021-03-19 ENCOUNTER — Ambulatory Visit: Payer: Federal, State, Local not specified - PPO | Admitting: Sports Medicine

## 2021-03-19 DIAGNOSIS — M79674 Pain in right toe(s): Secondary | ICD-10-CM | POA: Diagnosis not present

## 2021-03-19 DIAGNOSIS — Z79899 Other long term (current) drug therapy: Secondary | ICD-10-CM | POA: Diagnosis not present

## 2021-03-19 DIAGNOSIS — B351 Tinea unguium: Secondary | ICD-10-CM | POA: Diagnosis not present

## 2021-03-19 DIAGNOSIS — B353 Tinea pedis: Secondary | ICD-10-CM | POA: Diagnosis not present

## 2021-03-19 DIAGNOSIS — M79675 Pain in left toe(s): Secondary | ICD-10-CM

## 2021-03-19 NOTE — Progress Notes (Signed)
Subjective: Taylor Reynolds is a 60 y.o. female patient who returns to office for follow-up evaluation of nail fungus and for nail trim.  Patient reports that she has been forgetting to soak and use her topical but otherwise is doing okay.  Patient Active Problem List   Diagnosis Date Noted  . Primary hypertension 03/17/2021  . Elevated blood-pressure reading without diagnosis of hypertension 02/17/2021  . Colon cancer screening 05/26/2020  . History of colonic polyps 05/26/2020  . Polyp of colon 09/07/2019  . Leg edema 02/01/2019  . Acid reflux 03/08/2013  . Neck pain 04/05/2012  . Vitamin D deficiency disease 01/07/2012  . Obesity (BMI 35.0-39.9 without comorbidity) 01/07/2012  . Chest pain 02/12/2011  . Asthma 02/12/2011    Current Outpatient Medications on File Prior to Visit  Medication Sig Dispense Refill  . ergocalciferol (VITAMIN D2) 1.25 MG (50000 UT) capsule Take by mouth.    . hydrochlorothiazide (HYDRODIURIL) 25 MG tablet Take by mouth.    . norethindrone (AYGESTIN) 5 MG tablet Take by mouth.    Marland Kitchen albuterol (PROVENTIL HFA;VENTOLIN HFA) 108 (90 Base) MCG/ACT inhaler Inhale 1-2 puffs into the lungs every 6 (six) hours as needed for wheezing or shortness of breath. 1 Inhaler 0  . EPINEPHrine 0.3 mg/0.3 mL IJ SOAJ injection Inject into the muscle.     No current facility-administered medications on file prior to visit.    Allergies  Allergen Reactions  . Bee Venom Anaphylaxis  . Peanut Allergen Powder-Dnfp Anaphylaxis  . Peanuts [Peanut Oil] Anaphylaxis    Objective:  General: Alert and oriented x3 in no acute distress  Dermatology: No open lesions bilateral lower extremities, no webspace macerations, no ecchymosis bilateral, all nails x 10 are elongated, thick and mycotic with the left 1st toenail most invovled like before.  Vascular: Dorsalis Pedis and Posterior Tibial pedal pulses palpable, Capillary Fill Time 3 seconds,(+) pedal hair growth bilateral,  trace edema bilateral lower extremities, Temperature gradient within normal limits.  Neurology: Johney Maine sensation intact via light touch bilateral.  Musculoskeletal: No symptomatic pedal complaints noted at this visit. Strength within normal limits in all groups bilateral.   Assessment and Plan: Problem List Items Addressed This Visit   None   Visit Diagnoses    Onychomycosis    -  Primary   Tinea pedis of both feet       Toe pain, bilateral       Long-term use of high-risk medication          -Complete examination performed -Mechanically debrided all nails using sterile nipper without incident and filed nails with dremel without incident  -Recommend soaking and continuing with topical nail solution as previously prescribed -Advised patient to continue with treatment for tinea with over-the-counter Tinactin -Patient to return to office in 3 months or sooner if condition worsens.  Landis Martins, DPM

## 2021-05-12 NOTE — Progress Notes (Signed)
Virtual Visit via Video Note   Subjective:   Taylor Reynolds, female    DOB: [birth year], 60 y.o.   MRN: [id number]  Chief complaint:  Chief Complaint  Patient presents with   Shortness of Breath   Follow-up   Results    HPI  60 year old American female with morbid obesity, asthma, family history of coronary artery disease, seen for atypical chest pain and leg edema.  Patient presents for 47-monthfollow-up and results of cardiac testing, referred to our office for preoperative risk stratification prior to hernia operation, however advised undergoing further cardiac testing prior to surgical hernia repair.  At last office visit ordered stress test, echocardiogram, and deferred further management of hypertension and hyperlipidemia to patient's PCP.   Patient's echocardiogram is unchanged compared to previous in 2019 with normal LVEF, mild LVH, and mild valvular disease.  Stress test revealed low exercise capacity with no ischemic changes on EKG and normal myocardial perfusion imaging, for low risk study.  Patient reports she has been feeling stressed due to family and job applications lately, however is otherwise feeling well. Patient denies chest pain, palpitations, syncope, near syncope, dizziness, orthopnea.  Past Medical History:  Diagnosis Date   Abnormal Pap smear 06/1989   Anal itching 05/2009   ASCUS (atypical squamous cells of undetermined significance) on Pap smear 2006   Asthma    Axillary mass, right 05/2008   Chest pressure    H/O pelvic mass 2003   History of irregular menstrual bleeding 02/2011   History of measles, mumps, or rubella    Leg edema 02/01/2019   Ovarian cyst 01/2002   Pelvic pain 12/2005   Reflux    Strain of shoulder, left 04/2006   Urge incontinence 2001   Varicella    Past Surgical History:  Procedure Laterality Date   ACNE CYST REMOVAL     Under eye   CESAREAN SECTION     Social History   Tobacco Use   Smoking status: Never    Smokeless tobacco: Never  Substance Use Topics   Alcohol use: No  Marital Status: Divorced  Family History  Problem Relation Age of Onset   Emphysema Sister    Stroke Maternal Grandmother    Heart disease Maternal Grandmother    Diabetes Father    Diabetes Other    Heart disease Maternal Grandfather    Asthma Maternal Grandfather    Breast cancer Neg Hx    Current Outpatient Medications  Medication Instructions   albuterol (PROVENTIL HFA;VENTOLIN HFA) 108 (90 Base) MCG/ACT inhaler 1-2 puffs, Inhalation, Every 6 hours PRN   EPINEPHrine 0.3 mg/0.3 mL IJ SOAJ injection Intramuscular   ergocalciferol (VITAMIN D2) 1.25 MG (50000 UT) capsule Oral   hydrochlorothiazide (HYDRODIURIL) 25 MG tablet Oral   norethindrone (AYGESTIN) 5 MG tablet Oral   Allergies  Allergen Reactions   Bee Venom Anaphylaxis   Peanut Allergen Powder-Dnfp Anaphylaxis   Peanuts [Peanut Oil] Anaphylaxis    Cardiovascular studies: PCV MYOCARDIAL PERFUSION WO LEXISCAN 03/02/2021 Exercise nuclear stress test was performed using Bruce protocol. Patient reached 4.7 METS, and 92% of age predicted maximum heart rate. Exercise capacity was low. Noi chest pain reported. Heart rate and hemodynamic response were normal. Stress EKG revealed no ischemic changes. Normal myocardial perfusion. Stress LVEF 62%. Low risk study.  PCV ECHOCARDIOGRAM COMPLETE 086/76/7209Normal LV systolic function with visual EF 60-65%. Left ventricle cavity is normal in size. Mild left ventricular hypertrophy. Normal global wall motion. Normal diastolic filling pattern, normal LAP.  Left atrial cavity is mildly dilated. Right atrial cavity is mildly dilated. Mild (Grade I) mitral regurgitation. Mild to moderate tricuspid regurgitation. No evidence of pulmonary hypertension. Mild pulmonic regurgitation. Compared to study dated 10/16/2018 no significant change.   EKG 02/11/2021: Sinus rhythm at a rate of 75 bpm.  Normal axis.  Incomplete right  bundle branch block.   Lower Extremity Venous Duplex left leg 03/28/2019: No evidence of deep vein thrombosis of the left lower extremity with normal venous return. Tissue edema noted.   Echocardiogram 10/16/2018 1. Left ventricle cavity is normal in size. Mild concentric hypertrophy of the left ventricle. Normal global wall motion. Normal diastolic filling pattern. Calculated EF 65%. 2. Left atrial cavity is borderline dilated. 3. Mild (Grade I) mitral regurgitation. 4. Mild to moderate tricuspid regurgitation. No evidence of pulmonary hypertension.  Treadmill exercise stress test 10/13/2018: Indication: CP  The patient exercised on Bruce protocol for  7:00 min. Patient achieved  7.05 METS and reached HR  174 bpm, which is   106% of maximum age-predicted HR.  Stress test terminated due to Fatigue. Resting EKG demonstrates NSR, IRBBB. ST Changes: With peak exercise there was no ST-T changes of ischemia.   Arrhythmias: Occasional PVC . Chest Pain: none.  BP Response to Exercise: Resting hypertension 172/86- exaggerated response 252/56 mm Hg. HR Response to Exercise: Exaggerated HR response suggests decreased aerobic tolerence. Exercise capacity was below average for age. Recommendations: Continue primary/secondary prevention.  Recent labs: CMP Latest Ref Rng & Units 12/23/2020 09/08/2018 01/05/2018  Glucose 70 - 99 mg/dL 105(H) 89 -  BUN 6 - 20 mg/dL 11 10 -  Creatinine 0.44 - 1.00 mg/dL 0.84 0.90 -  Sodium 135 - 145 mmol/L 136 141 -  Potassium 3.5 - 5.1 mmol/L 3.7 4.0 -  Chloride 98 - 111 mmol/L 104 107 -  CO2 22 - 32 mmol/L 25 - -  Calcium 8.9 - 10.3 mg/dL 8.7(L) - -  Total Protein 6.5 - 8.1 g/dL 6.9 - 6.6  Total Bilirubin 0.3 - 1.2 mg/dL 0.5 - 0.3  Alkaline Phos 38 - 126 U/L 54 - -  AST 15 - 41 U/L 18 - 15  ALT 0 - 44 U/L 18 - 17   CBC Latest Ref Rng & Units 12/23/2020 09/08/2018 01/05/2018  WBC 4.0 - 10.5 K/uL 4.0 - 4.0  Hemoglobin 12.0 - 15.0 g/dL 12.9 13.6 12.1  Hematocrit  36.0 - 46.0 % 39.3 40.0 35.9  Platelets 150 - 400 K/uL 207 - 206   Lipid Panel  No results found for: CHOL, TRIG, HDL, CHOLHDL, VLDL, LDLCALC, LDLDIRECT HEMOGLOBIN A1C No results found for: HGBA1C, MPG TSH No results for input(s): TSH in the last 8760 hours.  External Labs:  09/23/2020:  A1c 6.0% Total cholesterol 216, triglycerides 52, HDL 95, LDL 112 Glucose 86, BUN 13, creatinine 0.86, GFR 75, sodium 143, potassium 4.2, alk phos 77, AST 16, ALT 16  02/20/2020:  Hemoglobin 13, hematocrit 38.7, MCV 86, platelet 228   Review of Systems  Cardiovascular:  Positive for leg swelling. Negative for chest pain, claudication, dyspnea on exertion (resolved), near-syncope, orthopnea, palpitations, paroxysmal nocturnal dyspnea and syncope.  Respiratory:  Negative for shortness of breath.   Gastrointestinal:  Negative for melena.  Neurological:  Negative for dizziness.       Objective:   Vitals:   05/13/21 1053  BP: 122/61  Pulse: 64  Temp: 97.6 F (36.4 C)  Height: _0  (1.651 m)  Weight: 246 lb (111.6 kg)  SpO2:  98%  BMI (Calculated): 40.94    Physical Exam Vitals reviewed.  Constitutional:      Appearance: She is obese.  HENT:     Head: Normocephalic and atraumatic.  Cardiovascular:     Rate and Rhythm: Normal rate and regular rhythm.     Pulses: Intact distal pulses.          Carotid pulses are 2+ on the right side and 2+ on the left side.      Radial pulses are 2+ on the right side and 2+ on the left side.       Dorsalis pedis pulses are 2+ on the right side and 2+ on the left side.       Posterior tibial pulses are 2+ on the right side and 2+ on the left side.     Heart sounds: S1 normal and S2 normal. No murmur heard.   No gallop.     Comments: Femoral and popliteal pulses difficult to evaluate due to patient body habitus. Pulmonary:     Effort: No respiratory distress.     Breath sounds: No wheezing, rhonchi or rales.  Musculoskeletal:     Right lower leg: No  edema.     Left lower leg: Edema (trace) present.  Skin:    General: Skin is warm and dry.  Neurological:     Mental Status: She is alert.      Assessment & Recommendations:     ICD-10-CM   1. Dyspnea on exertion  R06.00     2. Encounter for preoperative assessment  Z01.5       60 year old American female with morbid obesity, asthma, family history of coronary artery disease, seen for atypical chest pain and leg edema.  Encounter for preoperative risk evaluation: Reviewed and discussed with patient regarding results of echocardiogram and stress test, details above.  Echocardiogram was unchanged compared to prior and stress test was low risk.  Advised patient she may proceed with hernia repair with low risk of perioperative cardiovascular complications.  We will send restratification form to [facility] surgery.  Dyspnea on exertion: Significantly improved since last visit, suspect this is related to physical deconditioning.  Encourage patient to increase physical activity as well as focus on weight loss.  Bilateral lower leg edema: No evidence of heart failure and echocardiogram.  Patient's lower leg edema has essentially resolved since last visit.  Encouraged her to wear support stockings on a regular basis.  Hypertension: Well-controlled.  Will defer further management to PCP.  Discussed at length regarding diet and lifestyle modifications in order to reduce cardiovascular risks as well as lose weight. Further recommendations regarding upcoming hernia repair pending cardiac testing results.   Follow up in 1 year, sooner if needed, for cardiovascular risk management.   Alethia Berthold, PA-C 05/13/2021, 12:11 PM Office: [phone]

## 2021-05-13 ENCOUNTER — Encounter: Payer: Self-pay | Admitting: Student

## 2021-05-13 ENCOUNTER — Ambulatory Visit: Payer: Federal, State, Local not specified - PPO | Admitting: Student

## 2021-05-13 ENCOUNTER — Other Ambulatory Visit: Payer: Self-pay

## 2021-05-13 VITALS — BP 122/61 | HR 64 | Temp 97.6°F | Ht 65.0 in | Wt 246.0 lb

## 2021-05-13 DIAGNOSIS — R0600 Dyspnea, unspecified: Secondary | ICD-10-CM

## 2021-05-13 DIAGNOSIS — Z01818 Encounter for other preprocedural examination: Secondary | ICD-10-CM

## 2021-05-13 DIAGNOSIS — R0609 Other forms of dyspnea: Secondary | ICD-10-CM

## 2021-06-25 ENCOUNTER — Other Ambulatory Visit: Payer: Self-pay

## 2021-06-25 ENCOUNTER — Ambulatory Visit: Payer: Federal, State, Local not specified - PPO | Admitting: Sports Medicine

## 2021-06-25 DIAGNOSIS — M79674 Pain in right toe(s): Secondary | ICD-10-CM

## 2021-06-25 DIAGNOSIS — M79675 Pain in left toe(s): Secondary | ICD-10-CM

## 2021-06-25 DIAGNOSIS — B353 Tinea pedis: Secondary | ICD-10-CM

## 2021-06-25 DIAGNOSIS — M79609 Pain in unspecified limb: Secondary | ICD-10-CM

## 2021-06-25 DIAGNOSIS — B351 Tinea unguium: Secondary | ICD-10-CM

## 2021-06-25 DIAGNOSIS — Z79899 Other long term (current) drug therapy: Secondary | ICD-10-CM

## 2021-06-25 NOTE — Progress Notes (Signed)
Subjective: Emiko Tallerico is a 60 y.o. female patient who returns to office for follow-up evaluation of nail fungus and for nail trim.  Patient reports that she has been forgetting to use her topical everday but otherwise is doing okay.  Patient Active Problem List   Diagnosis Date Noted   Primary hypertension 03/17/2021   Elevated blood-pressure reading without diagnosis of hypertension 02/17/2021   Colon cancer screening 05/26/2020   History of colonic polyps 05/26/2020   Polyp of colon 09/07/2019   Leg edema 02/01/2019   Acid reflux 03/08/2013   Neck pain 04/05/2012   Vitamin D deficiency disease 01/07/2012   Obesity (BMI 35.0-39.9 without comorbidity) 01/07/2012   Chest pain 02/12/2011   Asthma 02/12/2011    Current Outpatient Medications on File Prior to Visit  Medication Sig Dispense Refill   albuterol (PROVENTIL HFA;VENTOLIN HFA) 108 (90 Base) MCG/ACT inhaler Inhale 1-2 puffs into the lungs every 6 (six) hours as needed for wheezing or shortness of breath. 1 Inhaler 0   EPINEPHrine 0.3 mg/0.3 mL IJ SOAJ injection Inject into the muscle.     ergocalciferol (VITAMIN D2) 1.25 MG (50000 UT) capsule Take by mouth.     hydrochlorothiazide (HYDRODIURIL) 25 MG tablet Take by mouth.     norethindrone (AYGESTIN) 5 MG tablet Take by mouth.     No current facility-administered medications on file prior to visit.    Allergies  Allergen Reactions   Bee Venom Anaphylaxis   Peanut Allergen Powder-Dnfp Anaphylaxis   Peanuts [Peanut Oil] Anaphylaxis    Objective:  General: Alert and oriented x3 in no acute distress  Dermatology: No open lesions bilateral lower extremities, no webspace macerations, no ecchymosis bilateral, all nails x 10 are elongated, thick and mycotic with the left 1st toenail most invovled like before.  Vascular: Dorsalis Pedis and Posterior Tibial pedal pulses palpable, Capillary Fill Time 3 seconds,(+) pedal hair growth bilateral, trace edema bilateral  lower extremities, Temperature gradient within normal limits.  Neurology: Johney Maine sensation intact via light touch bilateral.  Musculoskeletal: No symptomatic pedal complaints noted at this visit. Strength within normal limits in all groups bilateral.   Assessment and Plan: Problem List Items Addressed This Visit   None Visit Diagnoses     Pain due to onychomycosis of nail    -  Primary   Tinea pedis of both feet       Long-term use of high-risk medication       Toe pain, bilateral           -Complete examination performed -Mechanically debrided all nails using sterile nipper without incident and filed nails with dremel without incident  -Recommend soaking and continuing with topical nail solution as previously prescribed and to be consistent with use -Advised patient to continue with treatment for tinea with over-the-counter Tinactin like before -Patient to return to office in 3 months or sooner if condition worsens.  Landis Martins, DPM

## 2021-07-17 DIAGNOSIS — Z0000 Encounter for general adult medical examination without abnormal findings: Secondary | ICD-10-CM | POA: Diagnosis not present

## 2021-07-21 DIAGNOSIS — K429 Umbilical hernia without obstruction or gangrene: Secondary | ICD-10-CM | POA: Diagnosis not present

## 2021-09-07 ENCOUNTER — Encounter: Payer: Self-pay | Admitting: Emergency Medicine

## 2021-09-07 ENCOUNTER — Other Ambulatory Visit: Payer: Self-pay

## 2021-09-07 ENCOUNTER — Ambulatory Visit
Admission: EM | Admit: 2021-09-07 | Discharge: 2021-09-07 | Disposition: A | Discharge status: Home / Self Care | Payer: Federal, State, Local not specified - PPO | Attending: Physician Assistant | Admitting: Physician Assistant

## 2021-09-07 DIAGNOSIS — M545 Low back pain, unspecified: Secondary | ICD-10-CM

## 2021-09-07 MED ORDER — PREDNISONE 20 MG PO TABS: 40.0000 mg | ORAL_TABLET | Freq: Every day | ORAL | 0 refills | Status: AC

## 2021-09-07 NOTE — ED Triage Notes (Signed)
Patient c/o low back pain x 2 days, no apparent injury.  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  The patient has taken Tylenol, muscle relaxer and Ibuprofen.

## 2021-09-07 NOTE — ED Provider Notes (Signed)
[facility]    CSN: [id number] Arrival date & time: 09/07/21  1018      History   Chief Complaint Chief Complaint  Patient presents with   Back Pain    HPI Taylor Reynolds is a 60 y.o. female.   Patient here today for evaluation of left low back pain that started a few days ago. She reports that she has not had known injury but does have to  lift her mother often and does repetitive lifting at work as well. She has not had any numbness. She denies loss of bowel or bladder function. She reports movement makes pain worse. She denies any dysuria. She has tried muscle relaxer, ibuprofen, and heat with mild relief.   The history is provided by the patient.  Back Pain Associated symptoms: no abdominal pain, no fever and no numbness    Past Medical History:  Diagnosis Date   Abnormal Pap smear 06/1989   Anal itching 05/2009   ASCUS (atypical squamous cells of undetermined significance) on Pap smear 2006   Asthma    Axillary mass, right 05/2008   Chest pressure    H/O pelvic mass 2003   History of irregular menstrual bleeding 02/2011   History of measles, mumps, or rubella    Leg edema 02/01/2019   Ovarian cyst 01/2002   Pelvic pain 12/2005   Reflux    Strain of shoulder, left 04/2006   Urge incontinence 2001   Varicella     Patient Active Problem List   Diagnosis Date Noted   Primary hypertension 03/17/2021   Elevated blood-pressure reading without diagnosis of hypertension 02/17/2021   Colon cancer screening 05/26/2020   History of colonic polyps 05/26/2020   Polyp of colon 09/07/2019   Leg edema 02/01/2019   Acid reflux 03/08/2013   Neck pain 04/05/2012   Vitamin D deficiency disease 01/07/2012   Obesity (BMI 35.0-39.9 without comorbidity) 01/07/2012   Chest pain 02/12/2011   Asthma 02/12/2011    Past Surgical History:  Procedure Laterality Date   ACNE CYST REMOVAL     Under eye   CESAREAN SECTION      OB History     Gravida  4   Para   3   Term      Preterm      AB      Living  3      SAB      IAB      Ectopic      Multiple      Live Births               Home Medications    Prior to Admission medications   Medication Sig Start Date End Date Taking? Authorizing Provider  albuterol (PROVENTIL HFA;VENTOLIN HFA) 108 (90 Base) MCG/ACT inhaler Inhale 1-2 puffs into the lungs every 6 (six) hours as needed for wheezing or shortness of breath. 01/07/19  Yes Bast, Traci A, NP  EPINEPHrine 0.3 mg/0.3 mL IJ SOAJ injection Inject into the muscle. 11/01/17  Yes [provider]  ergocalciferol (VITAMIN D2) 1.25 MG (50000 UT) capsule Take by mouth. 02/23/21  Yes [provider]  hydrochlorothiazide (HYDRODIURIL) 25 MG tablet Take by mouth. 03/17/21 03/17/22 Yes [provider]  norethindrone (AYGESTIN) 5 MG tablet Take by mouth. 02/18/21  Yes [provider]  predniSONE (DELTASONE) 20 MG tablet Take 2 tablets (40 mg total) by mouth daily with breakfast for 5 days. 09/07/21 09/12/21 Yes Francene Finders, PA-C  Family History Family History  Problem Relation Age of Onset   Emphysema Sister    Stroke Maternal Grandmother    Heart disease Maternal Grandmother    Diabetes Father    Diabetes Other    Heart disease Maternal Grandfather    Asthma Maternal Grandfather    Breast cancer Neg Hx     Social History Social History   Tobacco Use   Smoking status: Never   Smokeless tobacco: Never  Vaping Use   Vaping Use: Never used  Substance Use Topics   Alcohol use: No   Drug use: No     Allergies   Bee venom, Peanut allergen powder-dnfp, and Peanuts [peanut oil]   Review of Systems Review of Systems  Constitutional:  Negative for chills and fever.  Eyes:  Negative for discharge and redness.  Gastrointestinal:  Negative for abdominal pain, nausea and vomiting.  Genitourinary:  Positive for vaginal bleeding and vaginal discharge.  Musculoskeletal:  Positive for back pain  and myalgias.  Neurological:  Negative for numbness.    Physical Exam Triage Vital Signs ED Triage Vitals [09/07/21 1241]  Enc Vitals Group     BP 137/69     Pulse Rate 83     Resp      Temp 98.3 F (36.8 C)     Temp Source Oral     SpO2 98 %     Weight 253 lb (114.8 kg)     Height 5' 5.5" (1.664 m)     Head Circumference      Peak Flow      Pain Score 6     Pain Loc      Pain Edu?      Excl. in [place]?    No data found.  Updated Vital Signs BP 137/69 (BP Location: Left Arm)   Pulse 83   Temp 98.3 F (36.8 C) (Oral)   Ht 5' 5.5" (1.664 m)   Wt 253 lb (114.8 kg)   LMP 10/06/2016 (Exact Date)   SpO2 98%   BMI 41.46 kg/m   Physical Exam Vitals and nursing note reviewed.  Constitutional:      General: She is not in acute distress.    Appearance: Normal appearance. She is not ill-appearing.  HENT:     Head: Normocephalic and atraumatic.  Eyes:     Conjunctiva/sclera: Conjunctivae normal.  Cardiovascular:     Rate and Rhythm: Normal rate.  Pulmonary:     Effort: Pulmonary effort is normal.  Musculoskeletal:     Comments: No TTP to midline spine, mild TTP to left low back, no TTP to right lower back.   Neurological:     Mental Status: She is alert.  Psychiatric:        Mood and Affect: Mood normal.        Behavior: Behavior normal.        Thought Content: Thought content normal.     UC Treatments / Results  Labs (all labs ordered are listed, but only abnormal results are displayed) Labs Reviewed - No data to display  EKG   Radiology No results found.  Procedures Procedures (including critical care time)  Medications Ordered in UC Medications - No data to display  Initial Impression / Assessment and Plan / UC Course  I have reviewed the triage vital signs and the nursing notes.  Pertinent labs & imaging results that were available during my care of the patient were reviewed by me and considered in my medical  decision making (see chart for  details).   Suspect likely muscular strain and will treat with steroid burst. Recommended heat as needed. Encouraged follow up if no gradual improvement or if symptoms worsen in any way.   Final Clinical Impressions(s) / UC Diagnoses   Final diagnoses:  Acute left-sided low back pain without sciatica   Discharge Instructions   None    ED Prescriptions     Medication Sig Dispense Auth. Provider   predniSONE (DELTASONE) 20 MG tablet Take 2 tablets (40 mg total) by mouth daily with breakfast for 5 days. 10 tablet Francene Finders, PA-C      PDMP not reviewed this encounter.   Francene Finders, PA-C 09/07/21 1406

## 2021-09-15 DIAGNOSIS — Z124 Encounter for screening for malignant neoplasm of cervix: Secondary | ICD-10-CM | POA: Diagnosis not present

## 2021-09-15 DIAGNOSIS — Z6841 Body Mass Index (BMI) 40.0 and over, adult: Secondary | ICD-10-CM | POA: Diagnosis not present

## 2021-09-15 DIAGNOSIS — Z01419 Encounter for gynecological examination (general) (routine) without abnormal findings: Secondary | ICD-10-CM | POA: Diagnosis not present

## 2021-09-15 DIAGNOSIS — M545 Low back pain, unspecified: Secondary | ICD-10-CM | POA: Diagnosis not present

## 2021-09-18 DIAGNOSIS — K429 Umbilical hernia without obstruction or gangrene: Secondary | ICD-10-CM | POA: Diagnosis not present

## 2021-09-24 ENCOUNTER — Ambulatory Visit: Payer: Federal, State, Local not specified - PPO | Admitting: Sports Medicine

## 2021-09-24 DIAGNOSIS — K429 Umbilical hernia without obstruction or gangrene: Secondary | ICD-10-CM | POA: Diagnosis not present

## 2021-09-30 ENCOUNTER — Encounter (HOSPITAL_COMMUNITY): Payer: Self-pay

## 2021-09-30 ENCOUNTER — Emergency Department (HOSPITAL_COMMUNITY): Payer: Federal, State, Local not specified - PPO

## 2021-09-30 ENCOUNTER — Emergency Department (HOSPITAL_COMMUNITY)
Admission: EM | Admit: 2021-09-30 | Discharge: 2021-10-01 | Disposition: A | Discharge status: Home / Self Care | Payer: Federal, State, Local not specified - PPO | Attending: Emergency Medicine | Admitting: Emergency Medicine

## 2021-09-30 ENCOUNTER — Other Ambulatory Visit: Payer: Self-pay

## 2021-09-30 ENCOUNTER — Ambulatory Visit (HOSPITAL_COMMUNITY)
Admission: RE | Admit: 2021-09-30 | Discharge: 2021-09-30 | Disposition: A | Discharge status: Home / Self Care | Payer: Federal, State, Local not specified - PPO | Source: Ambulatory Visit | Attending: Physician Assistant | Admitting: Physician Assistant

## 2021-09-30 VITALS — BP 112/60 | HR 113 | Temp 99.7°F | Resp 18

## 2021-09-30 DIAGNOSIS — G8918 Other acute postprocedural pain: Secondary | ICD-10-CM

## 2021-09-30 DIAGNOSIS — I10 Essential (primary) hypertension: Secondary | ICD-10-CM | POA: Insufficient documentation

## 2021-09-30 DIAGNOSIS — R0789 Other chest pain: Secondary | ICD-10-CM | POA: Insufficient documentation

## 2021-09-30 DIAGNOSIS — M549 Dorsalgia, unspecified: Secondary | ICD-10-CM

## 2021-09-30 DIAGNOSIS — M545 Low back pain, unspecified: Secondary | ICD-10-CM | POA: Diagnosis not present

## 2021-09-30 DIAGNOSIS — J45909 Unspecified asthma, uncomplicated: Secondary | ICD-10-CM | POA: Insufficient documentation

## 2021-09-30 DIAGNOSIS — Z91010 Allergy to peanuts: Secondary | ICD-10-CM | POA: Diagnosis not present

## 2021-09-30 DIAGNOSIS — R102 Pelvic and perineal pain: Secondary | ICD-10-CM | POA: Insufficient documentation

## 2021-09-30 DIAGNOSIS — S239XXA Sprain of unspecified parts of thorax, initial encounter: Secondary | ICD-10-CM

## 2021-09-30 DIAGNOSIS — Z79899 Other long term (current) drug therapy: Secondary | ICD-10-CM | POA: Insufficient documentation

## 2021-09-30 DIAGNOSIS — R079 Chest pain, unspecified: Secondary | ICD-10-CM | POA: Diagnosis not present

## 2021-09-30 DIAGNOSIS — X58XXXA Exposure to other specified factors, initial encounter: Secondary | ICD-10-CM | POA: Insufficient documentation

## 2021-09-30 DIAGNOSIS — S24109A Unspecified injury at unspecified level of thoracic spinal cord, initial encounter: Secondary | ICD-10-CM | POA: Diagnosis not present

## 2021-09-30 DIAGNOSIS — M5441 Lumbago with sciatica, right side: Secondary | ICD-10-CM

## 2021-09-30 DIAGNOSIS — N200 Calculus of kidney: Secondary | ICD-10-CM | POA: Diagnosis not present

## 2021-09-30 DIAGNOSIS — S29012A Strain of muscle and tendon of back wall of thorax, initial encounter: Secondary | ICD-10-CM | POA: Insufficient documentation

## 2021-09-30 DIAGNOSIS — M546 Pain in thoracic spine: Secondary | ICD-10-CM | POA: Diagnosis not present

## 2021-09-30 LAB — COMPREHENSIVE METABOLIC PANEL
ALT: 24 U/L (ref 0–44)
AST: 24 U/L (ref 15–41)
Albumin: 3.3 g/dL — ABNORMAL LOW (ref 3.5–5.0)
Alkaline Phosphatase: 65 U/L (ref 38–126)
Anion gap: 8 (ref 5–15)
BUN: 14 mg/dL (ref 6–20)
CO2: 24 mmol/L (ref 22–32)
Calcium: 9.8 mg/dL (ref 8.9–10.3)
Chloride: 102 mmol/L (ref 98–111)
Creatinine, Ser: 1.11 mg/dL — ABNORMAL HIGH (ref 0.44–1.00)
GFR, Estimated: 57 mL/min — ABNORMAL LOW (ref >60)
Glucose, Bld: 115 mg/dL — ABNORMAL HIGH (ref 70–99)
Potassium: 4.4 mmol/L (ref 3.5–5.1)
Sodium: 134 mmol/L — ABNORMAL LOW (ref 135–145)
Total Bilirubin: 0.9 mg/dL (ref 0.3–1.2)
Total Protein: 8 g/dL (ref 6.5–8.1)

## 2021-09-30 LAB — CBC WITH DIFFERENTIAL/PLATELET
Abs Immature Granulocytes: 0.02 10*3/uL (ref 0.00–0.07)
Basophils Absolute: 0 10*3/uL (ref 0.0–0.1)
Basophils Relative: 0 %
Eosinophils Absolute: 0.1 10*3/uL (ref 0.0–0.5)
Eosinophils Relative: 2 %
HCT: 43.3 % (ref 36.0–46.0)
Hemoglobin: 13.8 g/dL (ref 12.0–15.0)
Immature Granulocytes: 0 %
Lymphocytes Relative: 25 %
Lymphs Abs: 1.8 10*3/uL (ref 0.7–4.0)
MCH: 28.2 pg (ref 26.0–34.0)
MCHC: 31.9 g/dL (ref 30.0–36.0)
MCV: 88.4 fL (ref 80.0–100.0)
Monocytes Absolute: 0.8 10*3/uL (ref 0.1–1.0)
Monocytes Relative: 11 %
Neutro Abs: 4.5 10*3/uL (ref 1.7–7.7)
Neutrophils Relative %: 62 %
Platelets: 265 10*3/uL (ref 150–400)
RBC: 4.9 MIL/uL (ref 3.87–5.11)
RDW: 14.2 % (ref 11.5–15.5)
WBC: 7.3 10*3/uL (ref 4.0–10.5)
nRBC: 0 % (ref 0.0–0.2)

## 2021-09-30 LAB — URINALYSIS, ROUTINE W REFLEX MICROSCOPIC
Bilirubin Urine: NEGATIVE
Glucose, UA: NEGATIVE mg/dL
Ketones, ur: NEGATIVE mg/dL
Leukocytes,Ua: NEGATIVE
Nitrite: NEGATIVE
Protein, ur: NEGATIVE mg/dL
Specific Gravity, Urine: >1.03 — ABNORMAL HIGH (ref 1.005–1.030)
pH: 6 (ref 5.0–8.0)

## 2021-09-30 LAB — URINALYSIS, MICROSCOPIC (REFLEX): Bacteria, UA: NONE SEEN

## 2021-09-30 LAB — TROPONIN I (HIGH SENSITIVITY): Troponin I (High Sensitivity): 5 ng/L (ref <18)

## 2021-09-30 LAB — LIPASE, BLOOD: Lipase: 26 U/L (ref 11–51)

## 2021-09-30 MED ORDER — IOHEXOL 350 MG/ML SOLN
100.0000 mL | Freq: Once | INTRAVENOUS | Status: AC | PRN
  Administered 2021-09-30: 22:00:00 100 mL via INTRAVENOUS

## 2021-09-30 NOTE — ED Triage Notes (Signed)
Pt c/o lower back pain since Saturday that has radiated to mid-upper back & R arm. Denies injury to area; advises outpt umbilical hernia sx Thursday, no issues in healing. Pain at rest 6/10, pain movement 10/10. 5-325 oxy q12, heat/ice does not help.

## 2021-09-30 NOTE — Discharge Instructions (Signed)
Go to the ED

## 2021-09-30 NOTE — ED Provider Notes (Signed)
Emergency Medicine Provider Triage Evaluation Note  Taylor Reynolds , a 60 y.o. female  was evaluated in triage.  Pt complains of sent from urgent care. She is about 1 week postop, she has had lower  back pain since Saturday that radiated into the mid upper back and right arm.  She denies any injuries.  She denies significant abdominal pain.  She was found to be tachycardic at urgent care and sent here.  Review of Systems  Positive: See above Negative:   Physical Exam  BP 138/70    Pulse (!) 108    Temp 99.4 F (37.4 C) (Oral)    Resp 16    LMP 10/06/2016 (Exact Date)    SpO2 99%  Gen:   Awake, no distress   Resp:  Normal effort  MSK:   Moves extremities without difficulty  Other:  Patient is afebrile and tachycardic.  Medical Decision Making  Medically screening exam initiated at 8:26 PM.  Appropriate orders placed.  Bryn Gulling Frechette was informed that the remainder of the evaluation will be completed by another provider, this initial triage assessment does not replace that evaluation, and the importance of remaining in the ED until their evaluation is complete.  Patient sent from urgent care for increased concern for PE. As she is recently postop I would anticipate a dimer being elevated.  Given this I doubt a D-dimer would be useful, instead we will proceed for CTA.  She denies any allergies. Given that she also has had lower back pain and is recently postop abdominal surgery we will also obtain CT abdomen pelvis with contrast.  Note: Portions of this report may have been transcribed using voice recognition software. Every effort was made to ensure accuracy; however, inadvertent computerized transcription errors may be present    Ollen Gross 09/30/21 2030    Charlesetta Shanks, MD 10/03/21 [phone]

## 2021-09-30 NOTE — ED Provider Notes (Signed)
[facility]    CSN: [id number] Arrival date & time: 09/30/21  1749      History   Chief Complaint Chief Complaint  Patient presents with   Back Pain    HPI Taylor Reynolds is a 60 y.o. female.   Pt complains of pain in upper back and right side of chest.  Pt reports she had hernia surgery 1 week ago.  Pt reports she developed pain in her back and chest on Saturday.  Pt complains of pain in her right arm. Arm hurts to move.  Pt has pain with taking a deep breath.  Pt reports pain increases with deep breaths.     Back Pain  Past Medical History:  Diagnosis Date   Abnormal Pap smear 06/1989   Anal itching 05/2009   ASCUS (atypical squamous cells of undetermined significance) on Pap smear 2006   Asthma    Axillary mass, right 05/2008   Chest pressure    H/O pelvic mass 2003   History of irregular menstrual bleeding 02/2011   History of measles, mumps, or rubella    Leg edema 02/01/2019   Ovarian cyst 01/2002   Pelvic pain 12/2005   Reflux    Strain of shoulder, left 04/2006   Urge incontinence 2001   Varicella     Patient Active Problem List   Diagnosis Date Noted   Primary hypertension 03/17/2021   Elevated blood-pressure reading without diagnosis of hypertension 02/17/2021   Colon cancer screening 05/26/2020   History of colonic polyps 05/26/2020   Polyp of colon 09/07/2019   Leg edema 02/01/2019   Acid reflux 03/08/2013   Neck pain 04/05/2012   Vitamin D deficiency disease 01/07/2012   Obesity (BMI 35.0-39.9 without comorbidity) 01/07/2012   Chest pain 02/12/2011   Asthma 02/12/2011    Past Surgical History:  Procedure Laterality Date   ACNE CYST REMOVAL     Under eye   CESAREAN SECTION      OB History     Gravida  4   Para  3   Term      Preterm      AB      Living  3      SAB      IAB      Ectopic      Multiple      Live Births               Home Medications    Prior to Admission medications    Medication Sig Start Date End Date Taking? Authorizing Provider  albuterol (PROVENTIL HFA;VENTOLIN HFA) 108 (90 Base) MCG/ACT inhaler Inhale 1-2 puffs into the lungs every 6 (six) hours as needed for wheezing or shortness of breath. 01/07/19   Bast, Traci A, NP  EPINEPHrine 0.3 mg/0.3 mL IJ SOAJ injection Inject into the muscle. 11/01/17   [provider]  ergocalciferol (VITAMIN D2) 1.25 MG (50000 UT) capsule Take by mouth. 02/23/21   [provider]  hydrochlorothiazide (HYDRODIURIL) 25 MG tablet Take by mouth. 03/17/21 03/17/22  [provider]  norethindrone (AYGESTIN) 5 MG tablet Take by mouth. 02/18/21   [provider]    Family History Family History  Problem Relation Age of Onset   Emphysema Sister    Stroke Maternal Grandmother    Heart disease Maternal Grandmother    Diabetes Father    Diabetes Other    Heart disease Maternal Grandfather    Asthma Maternal Grandfather    Breast  cancer Neg Hx     Social History Social History   Tobacco Use   Smoking status: Never   Smokeless tobacco: Never  Vaping Use   Vaping Use: Never used  Substance Use Topics   Alcohol use: No   Drug use: No     Allergies   Bee venom, Peanut allergen powder-dnfp, and Peanuts [peanut oil]   Review of Systems Review of Systems  Musculoskeletal:  Positive for back pain.  All other systems reviewed and are negative.   Physical Exam Triage Vital Signs ED Triage Vitals  Enc Vitals Group     BP 09/30/21 1819 112/60     Pulse Rate 09/30/21 1819 (!) 113     Resp 09/30/21 1819 18     Temp 09/30/21 1819 99.7 F (37.6 C)     Temp Source 09/30/21 1819 Oral     SpO2 09/30/21 1819 97 %     Weight --      Height --      Head Circumference --      Peak Flow --      Pain Score 09/30/21 1820 9     Pain Loc --      Pain Edu? --      Excl. in [place]? --    No data found.  Updated Vital Signs BP 112/60 (BP Location: Left Arm)    Pulse (!) 113    Temp 99.7 F (37.6  C) (Oral)    Resp 18    LMP 10/06/2016 (Exact Date)    SpO2 97%   Visual Acuity Right Eye Distance:   Left Eye Distance:   Bilateral Distance:    Right Eye Near:   Left Eye Near:    Bilateral Near:     Physical Exam Vitals reviewed.  Constitutional:      Appearance: Normal appearance.  Cardiovascular:     Rate and Rhythm: Normal rate.  Pulmonary:     Effort: Pulmonary effort is normal.  Musculoskeletal:     Cervical back: Normal range of motion.     Comments: Diffusely tender upper and mid back   Skin:    General: Skin is warm.  Neurological:     General: No focal deficit present.     Mental Status: She is alert.  Psychiatric:        Mood and Affect: Mood normal.     UC Treatments / Results  Labs (all labs ordered are listed, but only abnormal results are displayed) Labs Reviewed - No data to display  EKG   Radiology No results found.  Procedures Procedures (including critical care time)  Medications Ordered in UC Medications - No data to display  Initial Impression / Assessment and Plan / UC Course  I have reviewed the triage vital signs and the nursing notes.  Pertinent labs & imaging results that were available during my care of the patient were reviewed by me and considered in my medical decision making (see chart for details).     MDM:  Pt tachycardic, recent surgery, chest and back pain.  Pt's presentation concerning for PE.  Pt to Ed for evaluation  Final Clinical Impressions(s) / UC Diagnoses   Final diagnoses:  Acute back pain, unspecified back location, unspecified back pain laterality   Discharge Instructions   None    ED Prescriptions   None    PDMP not reviewed this encounter.   Fransico Meadow, [place] 09/30/21 6789

## 2021-09-30 NOTE — ED Triage Notes (Signed)
Pt c/o mid to upper back pain radiating to rt arm since Saturday. Denies injury. States had hernia surgery last Thursday and was sitting around in the recliner with the pain started. States took oxycodone last night with no relief.

## 2021-09-30 NOTE — ED Notes (Signed)
Patient is being discharged from the Urgent Care and sent to the Emergency Department via POV . Per Sofia,PA, patient is in need of higher level of care due to need of further evaluation. Patient is aware and verbalizes understanding of plan of care.  Vitals:   09/30/21 1819  BP: 112/60  Pulse: (!) 113  Resp: 18  Temp: 99.7 F (37.6 C)  SpO2: 97%

## 2021-10-01 ENCOUNTER — Ambulatory Visit: Payer: Federal, State, Local not specified - PPO | Admitting: Sports Medicine

## 2021-10-01 LAB — TROPONIN I (HIGH SENSITIVITY): Troponin I (High Sensitivity): 9 ng/L (ref <18)

## 2021-10-01 MED ORDER — CYCLOBENZAPRINE HCL 10 MG PO TABS: 10.0000 mg | ORAL_TABLET | Freq: Three times a day (TID) | ORAL | 0 refills | Status: DC | PRN

## 2021-10-01 MED ORDER — CYCLOBENZAPRINE HCL 10 MG PO TABS
10.0000 mg | ORAL_TABLET | Freq: Once | ORAL | Status: AC
  Administered 2021-10-01: 10 mg via ORAL
  Filled 2021-10-01: qty 1

## 2021-10-01 NOTE — ED Notes (Signed)
ED Provider at bedside. 

## 2021-10-01 NOTE — ED Provider Notes (Signed)
[facility] EMERGENCY DEPARTMENT Provider Note   CSN: [id number] Arrival date & time: 09/30/21  1851     History Chief Complaint  Patient presents with   Back Pain    Taylor Reynolds is a 60 y.o. female.  HPI     60yo female with history of umbilical hernia repair one week ago presents with concern for back pain radiating to the chest.    Pain in the lower back that is radiating upwards to right shoulder, arm, right chest, right upper back.  Hurts to move. Pain worse with deep breaths. Because it is painful to take deep breath feels dyspnea.  No n/v. Had constipation but has had BM.  No fevers.   Past Medical History:  Diagnosis Date   Abnormal Pap smear 06/1989   Anal itching 05/2009   ASCUS (atypical squamous cells of undetermined significance) on Pap smear 2006   Asthma    Axillary mass, right 05/2008   Chest pressure    H/O pelvic mass 2003   History of irregular menstrual bleeding 02/2011   History of measles, mumps, or rubella    Leg edema 02/01/2019   Ovarian cyst 01/2002   Pelvic pain 12/2005   Reflux    Strain of shoulder, left 04/2006   Urge incontinence 2001   Varicella     Patient Active Problem List   Diagnosis Date Noted   Primary hypertension 03/17/2021   Elevated blood-pressure reading without diagnosis of hypertension 02/17/2021   Colon cancer screening 05/26/2020   History of colonic polyps 05/26/2020   Polyp of colon 09/07/2019   Leg edema 02/01/2019   Acid reflux 03/08/2013   Neck pain 04/05/2012   Vitamin D deficiency disease 01/07/2012   Obesity (BMI 35.0-39.9 without comorbidity) 01/07/2012   Chest pain 02/12/2011   Asthma 02/12/2011    Past Surgical History:  Procedure Laterality Date   ACNE CYST REMOVAL     Under eye   CESAREAN SECTION       OB History     Gravida  4   Para  3   Term      Preterm      AB      Living  3      SAB      IAB      Ectopic      Multiple      Live Births               Family History  Problem Relation Age of Onset   Emphysema Sister    Stroke Maternal Grandmother    Heart disease Maternal Grandmother    Diabetes Father    Diabetes Other    Heart disease Maternal Grandfather    Asthma Maternal Grandfather    Breast cancer Neg Hx     Social History   Tobacco Use   Smoking status: Never   Smokeless tobacco: Never  Vaping Use   Vaping Use: Never used  Substance Use Topics   Alcohol use: No   Drug use: No    Home Medications Prior to Admission medications   Medication Sig Start Date End Date Taking? Authorizing Provider  cyclobenzaprine (FLEXERIL) 10 MG tablet Take 1 tablet (10 mg total) by mouth 3 (three) times daily as needed for muscle spasms. 10/01/21  Yes Gareth Morgan, MD  albuterol (PROVENTIL HFA;VENTOLIN HFA) 108 (90 Base) MCG/ACT inhaler Inhale 1-2 puffs into the lungs every 6 (six) hours as needed for wheezing or shortness  of breath. 01/07/19   Bast, Tressia Miners A, NP  EPINEPHrine 0.3 mg/0.3 mL IJ SOAJ injection Inject into the muscle. 11/01/17   [provider]  ergocalciferol (VITAMIN D2) 1.25 MG (50000 UT) capsule Take by mouth. 02/23/21   [provider]  hydrochlorothiazide (HYDRODIURIL) 25 MG tablet Take by mouth. 03/17/21 03/17/22  [provider]  norethindrone (AYGESTIN) 5 MG tablet Take by mouth. 02/18/21   [provider]    Allergies    Bee venom, Peanut allergen powder-dnfp, and Peanuts [peanut oil]  Review of Systems   Review of Systems  Constitutional:  Negative for fever.  HENT:  Negative for sore throat.   Eyes:  Negative for visual disturbance.  Respiratory:  Negative for cough and shortness of breath.   Cardiovascular:  Positive for chest pain.  Gastrointestinal:  Positive for abdominal pain (denies significant). Negative for constipation, diarrhea, nausea and vomiting.  Genitourinary:  Negative for difficulty urinating.  Musculoskeletal:  Positive for back pain.  Negative for neck pain.  Skin:  Negative for rash.  Neurological:  Negative for syncope and headaches.   Physical Exam Updated Vital Signs BP 132/70    Pulse 70    Temp 98.4 F (36.9 C) (Oral)    Resp 16    LMP 10/06/2016 (Exact Date)    SpO2 100%   Physical Exam Vitals and nursing note reviewed.  Constitutional:      General: She is not in acute distress.    Appearance: She is well-developed. She is not diaphoretic.  HENT:     Head: Normocephalic and atraumatic.  Eyes:     Conjunctiva/sclera: Conjunctivae normal.  Cardiovascular:     Rate and Rhythm: Normal rate and regular rhythm.     Heart sounds: Normal heart sounds. No murmur heard.   No friction rub. No gallop.  Pulmonary:     Effort: Pulmonary effort is normal. No respiratory distress.     Breath sounds: Normal breath sounds. No wheezing or rales.  Abdominal:     General: There is no distension.     Palpations: Abdomen is soft.     Tenderness: There is no abdominal tenderness. There is no guarding.     Comments: Incision clean/dry intact   Musculoskeletal:        General: No tenderness.     Cervical back: Normal range of motion.  Skin:    General: Skin is warm and dry.     Findings: No erythema or rash.  Neurological:     Mental Status: She is alert and oriented to person, place, and time.    ED Results / Procedures / Treatments   Labs (all labs ordered are listed, but only abnormal results are displayed) Labs Reviewed  COMPREHENSIVE METABOLIC PANEL - Abnormal; Notable for the following components:      Result Value   Sodium 134 (*)    Glucose, Bld 115 (*)    Creatinine, Ser 1.11 (*)    Albumin 3.3 (*)    GFR, Estimated 57 (*)    All other components within normal limits  URINALYSIS, ROUTINE W REFLEX MICROSCOPIC - Abnormal; Notable for the following components:   Specific Gravity, Urine >1.030 (*)    Hgb urine dipstick TRACE (*)    All other components within normal limits  CBC WITH  DIFFERENTIAL/PLATELET  LIPASE, BLOOD  URINALYSIS, MICROSCOPIC (REFLEX)  TROPONIN I (HIGH SENSITIVITY)  TROPONIN I (HIGH SENSITIVITY)    EKG None  Radiology No results found.  Procedures Procedures  Medications Ordered in ED Medications  iohexol (OMNIPAQUE) 350 MG/ML injection 100 mL (100 mLs Intravenous Contrast Given 09/30/21 2217)  cyclobenzaprine (FLEXERIL) tablet 10 mg (10 mg Oral Given 10/01/21 0620)    ED Course  I have reviewed the triage vital signs and the nursing notes.  Pertinent labs & imaging results that were available during my care of the patient were reviewed by me and considered in my medical decision making (see chart for details).    MDM Rules/Calculators/A&P                            60yo female with history of umbilical hernia repair one week ago presents with concern for back pain radiating to the chest.  Labs without significant change-mild Cr elevation in comparison to March likely in setting of dehydration. Troponin negative, no sign of ACS. No anemia, no leukocytosis. UA without infection. CT PE study and CT abdomen pelvis without acute abnormalities.  Suspect pain related to post operative pain and msk pain.  Recommend continued supportive care. Patient discharged in stable condition with understanding of reasons to return.    Final Clinical Impression(s) / ED Diagnoses Final diagnoses:  Acute right-sided low back pain with right-sided sciatica  Thoracic back sprain, initial encounter  Postoperative pain    Rx / DC Orders ED Discharge Orders          Ordered    cyclobenzaprine (FLEXERIL) 10 MG tablet  3 times daily PRN        10/01/21 6063             Gareth Morgan, MD 10/02/21 2328

## 2021-10-22 DIAGNOSIS — R319 Hematuria, unspecified: Secondary | ICD-10-CM | POA: Diagnosis not present

## 2021-10-22 DIAGNOSIS — Z6841 Body Mass Index (BMI) 40.0 and over, adult: Secondary | ICD-10-CM | POA: Diagnosis not present

## 2021-10-22 DIAGNOSIS — E559 Vitamin D deficiency, unspecified: Secondary | ICD-10-CM | POA: Diagnosis not present

## 2021-10-22 DIAGNOSIS — Z8051 Family history of malignant neoplasm of kidney: Secondary | ICD-10-CM | POA: Diagnosis not present

## 2021-10-22 DIAGNOSIS — Z0000 Encounter for general adult medical examination without abnormal findings: Secondary | ICD-10-CM | POA: Diagnosis not present

## 2021-10-22 DIAGNOSIS — R7301 Impaired fasting glucose: Secondary | ICD-10-CM | POA: Diagnosis not present

## 2021-10-22 DIAGNOSIS — J4520 Mild intermittent asthma, uncomplicated: Secondary | ICD-10-CM | POA: Diagnosis not present

## 2021-10-22 DIAGNOSIS — Z23 Encounter for immunization: Secondary | ICD-10-CM | POA: Diagnosis not present

## 2021-10-22 DIAGNOSIS — I10 Essential (primary) hypertension: Secondary | ICD-10-CM | POA: Diagnosis not present

## 2021-11-05 ENCOUNTER — Ambulatory Visit: Payer: Federal, State, Local not specified - PPO | Admitting: Sports Medicine

## 2021-11-05 ENCOUNTER — Other Ambulatory Visit: Payer: Self-pay

## 2021-11-05 DIAGNOSIS — B351 Tinea unguium: Secondary | ICD-10-CM

## 2021-11-05 DIAGNOSIS — Z79899 Other long term (current) drug therapy: Secondary | ICD-10-CM

## 2021-11-05 DIAGNOSIS — B353 Tinea pedis: Secondary | ICD-10-CM

## 2021-11-05 DIAGNOSIS — M79609 Pain in unspecified limb: Secondary | ICD-10-CM

## 2021-11-05 NOTE — Patient Instructions (Signed)
Vinegar soaks 1 cup of white distilled vinegar to 8 cups of warm water.  Soak 20 mins. May repeat soak two times per week.  If there is thickness to nails may file nails after soaks or after bath/shower with nail file and apply tea tree oil. Apply oil daily to nails after filing for the best result.   Eucerin cream daily  Spray feet with Tinactin or Lamisil spray if itchiness

## 2021-11-05 NOTE — Progress Notes (Signed)
Subjective: Taylor Reynolds is a 61 y.o. female patient who returns to office for follow-up evaluation of nail fungus and for nail trim.  Patient reports that she still has not tried to use topical as I previously recommended every day but otherwise is doing okay.  Patient Active Problem List   Diagnosis Date Noted   Primary hypertension 03/17/2021   Elevated blood-pressure reading without diagnosis of hypertension 02/17/2021   Colon cancer screening 05/26/2020   History of colonic polyps 05/26/2020   Polyp of colon 09/07/2019   Leg edema 02/01/2019   Acid reflux 03/08/2013   Neck pain 04/05/2012   Vitamin D deficiency disease 01/07/2012   Obesity (BMI 35.0-39.9 without comorbidity) 01/07/2012   Chest pain 02/12/2011   Asthma 02/12/2011    Current Outpatient Medications on File Prior to Visit  Medication Sig Dispense Refill   albuterol (PROVENTIL HFA;VENTOLIN HFA) 108 (90 Base) MCG/ACT inhaler Inhale 1-2 puffs into the lungs every 6 (six) hours as needed for wheezing or shortness of breath. 1 Inhaler 0   cyclobenzaprine (FLEXERIL) 10 MG tablet Take 1 tablet (10 mg total) by mouth 3 (three) times daily as needed for muscle spasms. 20 tablet 0   EPINEPHrine 0.3 mg/0.3 mL IJ SOAJ injection Inject into the muscle.     ergocalciferol (VITAMIN D2) 1.25 MG (50000 UT) capsule Take by mouth.     hydrochlorothiazide (HYDRODIURIL) 25 MG tablet Take by mouth.     norethindrone (AYGESTIN) 5 MG tablet Take by mouth.     No current facility-administered medications on file prior to visit.    Allergies  Allergen Reactions   Bee Venom Anaphylaxis   Peanut Allergen Powder-Dnfp Anaphylaxis   Peanuts [Peanut Oil] Anaphylaxis    Objective:  General: Alert and oriented x3 in no acute distress  Dermatology: No open lesions bilateral lower extremities, no webspace macerations, no ecchymosis bilateral, all nails x 10 are elongated, thick and mycotic with the left 1st toenail most invovled  like before.  Vascular: Dorsalis Pedis and Posterior Tibial pedal pulses palpable, Capillary Fill Time 3 seconds,(+) pedal hair growth bilateral, trace edema bilateral lower extremities, Temperature gradient within normal limits.  Neurology: Johney Maine sensation intact via light touch bilateral.  Musculoskeletal: No symptomatic pedal complaints noted at this visit. Strength within normal limits in all groups bilateral.   Assessment and Plan: Problem List Items Addressed This Visit   None Visit Diagnoses     Pain due to onychomycosis of nail    -  Primary   Tinea pedis of both feet       Long-term use of high-risk medication           -Complete examination performed -Mechanically debrided all nails using sterile nipper without incident and filed nails with dremel without incident  -Recommend soaking and continuing with topical nail solution as previously prescribed and to be consistent with use like before since she has not started using this -Advised patient to continue with treatment for tinea with over-the-counter Tinactin like before and to be consistent with using this -Patient to return to office in 3 months or sooner if condition worsens.  Landis Martins, DPM

## 2021-11-10 ENCOUNTER — Other Ambulatory Visit: Payer: Self-pay | Admitting: Obstetrics and Gynecology

## 2021-11-10 DIAGNOSIS — M7541 Impingement syndrome of right shoulder: Secondary | ICD-10-CM | POA: Diagnosis not present

## 2021-11-10 DIAGNOSIS — R52 Pain, unspecified: Secondary | ICD-10-CM | POA: Diagnosis not present

## 2021-11-10 DIAGNOSIS — Z1231 Encounter for screening mammogram for malignant neoplasm of breast: Secondary | ICD-10-CM

## 2021-11-30 ENCOUNTER — Ambulatory Visit: Payer: Federal, State, Local not specified - PPO

## 2021-12-01 ENCOUNTER — Ambulatory Visit
Admission: RE | Admit: 2021-12-01 | Discharge: 2021-12-01 | Disposition: A | Discharge status: Home / Self Care | Payer: Federal, State, Local not specified - PPO | Source: Ambulatory Visit | Attending: Obstetrics and Gynecology | Admitting: Obstetrics and Gynecology

## 2021-12-01 ENCOUNTER — Other Ambulatory Visit: Payer: Self-pay

## 2021-12-01 ENCOUNTER — Ambulatory Visit: Payer: Federal, State, Local not specified - PPO

## 2021-12-01 DIAGNOSIS — Z1231 Encounter for screening mammogram for malignant neoplasm of breast: Secondary | ICD-10-CM

## 2022-02-04 ENCOUNTER — Ambulatory Visit (INDEPENDENT_AMBULATORY_CARE_PROVIDER_SITE_OTHER): Payer: Federal, State, Local not specified - PPO | Admitting: Sports Medicine

## 2022-02-04 ENCOUNTER — Encounter: Payer: Self-pay | Admitting: Sports Medicine

## 2022-02-04 DIAGNOSIS — B353 Tinea pedis: Secondary | ICD-10-CM

## 2022-02-04 DIAGNOSIS — B351 Tinea unguium: Secondary | ICD-10-CM

## 2022-02-04 DIAGNOSIS — M79609 Pain in unspecified limb: Secondary | ICD-10-CM

## 2022-02-04 DIAGNOSIS — Z79899 Other long term (current) drug therapy: Secondary | ICD-10-CM

## 2022-02-04 NOTE — Progress Notes (Signed)
Subjective: ?Taylor Reynolds is a 61 y.o. female patient who returns to office for follow-up evaluation of nail fungus and for nail trim.  Patient reports that she has been doing better with using daily skin emollients and this has helped the dry skin.  Denies any other pedal complaints or concerns at this time. ? ?Patient Active Problem List  ? Diagnosis Date Noted  ? Primary hypertension 03/17/2021  ? Elevated blood-pressure reading without diagnosis of hypertension 02/17/2021  ? Colon cancer screening 05/26/2020  ? History of colonic polyps 05/26/2020  ? Polyp of colon 09/07/2019  ? Leg edema 02/01/2019  ? Acid reflux 03/08/2013  ? Neck pain 04/05/2012  ? Vitamin D deficiency disease 01/07/2012  ? Obesity (BMI 35.0-39.9 without comorbidity) 01/07/2012  ? Chest pain 02/12/2011  ? Asthma 02/12/2011  ? ? ?Current Outpatient Medications on File Prior to Visit  ?Medication Sig Dispense Refill  ? albuterol (PROVENTIL HFA;VENTOLIN HFA) 108 (90 Base) MCG/ACT inhaler Inhale 1-2 puffs into the lungs every 6 (six) hours as needed for wheezing or shortness of breath. 1 Inhaler 0  ? cyclobenzaprine (FLEXERIL) 10 MG tablet Take 1 tablet (10 mg total) by mouth 3 (three) times daily as needed for muscle spasms. 20 tablet 0  ? EPINEPHrine 0.3 mg/0.3 mL IJ SOAJ injection Inject into the muscle.    ? ergocalciferol (VITAMIN D2) 1.25 MG (50000 UT) capsule Take by mouth.    ? hydrochlorothiazide (HYDRODIURIL) 25 MG tablet Take by mouth.    ? norethindrone (AYGESTIN) 5 MG tablet Take by mouth.    ? ?No current facility-administered medications on file prior to visit.  ? ? ?Allergies  ?Allergen Reactions  ? Bee Venom Anaphylaxis  ? Peanut Allergen Powder-Dnfp Anaphylaxis  ? Peanuts [Peanut Oil] Anaphylaxis  ? ? ?Objective:  ?General: Alert and oriented x3 in no acute distress ? ?Dermatology: No open lesions bilateral lower extremities, no webspace macerations, no ecchymosis bilateral, all nails x 10 are elongated, thick and  mycotic with the left 1st toenail most invovled like before.  Dry skin the plantar surfaces appears to be slowly improving from previous. ? ?Vascular: Dorsalis Pedis and Posterior Tibial pedal pulses palpable, Capillary Fill Time 3 seconds,(+) pedal hair growth bilateral, trace edema bilateral lower extremities, Temperature gradient within normal limits. ? ?Neurology: Gross sensation intact via light touch bilateral. ? ?Musculoskeletal: Asymptomatic pes planus pedal complaints noted at this visit. Strength within normal limits in all groups bilateral.  ? ?Assessment and Plan: ?Problem List Items Addressed This Visit   ?None ?Visit Diagnoses   ? ? Pain due to onychomycosis of nail    -  Primary  ? Tinea pedis of both feet      ? Long-term use of high-risk medication      ? ?  ?  ?-Complete examination performed ?-Mechanically debrided all painful nails using sterile nipper without incident and filed nails with dremel without incident  ?-Continue with topical nail solution as previously prescribed and encourage patient use of daily skin emollients ?-Advised patient to continue with treatment for tinea with over-the-counter Tinactin like before and to be consistent with using this to help to see improvement ?-Patient to return to office in 3 months for nail care or sooner if condition worsens. ? ?Landis Martins, DPM ? ?

## 2022-04-01 DIAGNOSIS — R319 Hematuria, unspecified: Secondary | ICD-10-CM | POA: Diagnosis not present

## 2022-04-05 DIAGNOSIS — R300 Dysuria: Secondary | ICD-10-CM | POA: Diagnosis not present

## 2022-04-05 DIAGNOSIS — E559 Vitamin D deficiency, unspecified: Secondary | ICD-10-CM | POA: Diagnosis not present

## 2022-04-05 DIAGNOSIS — Z1211 Encounter for screening for malignant neoplasm of colon: Secondary | ICD-10-CM | POA: Diagnosis not present

## 2022-04-05 DIAGNOSIS — R7301 Impaired fasting glucose: Secondary | ICD-10-CM | POA: Diagnosis not present

## 2022-04-16 ENCOUNTER — Encounter: Payer: Self-pay | Admitting: Podiatry

## 2022-04-16 ENCOUNTER — Ambulatory Visit: Payer: Federal, State, Local not specified - PPO | Admitting: Podiatry

## 2022-04-16 ENCOUNTER — Ambulatory Visit (INDEPENDENT_AMBULATORY_CARE_PROVIDER_SITE_OTHER): Payer: Federal, State, Local not specified - PPO

## 2022-04-16 DIAGNOSIS — M25571 Pain in right ankle and joints of right foot: Secondary | ICD-10-CM

## 2022-04-16 DIAGNOSIS — M7751 Other enthesopathy of right foot: Secondary | ICD-10-CM | POA: Diagnosis not present

## 2022-04-16 DIAGNOSIS — M779 Enthesopathy, unspecified: Secondary | ICD-10-CM | POA: Diagnosis not present

## 2022-04-16 MED ORDER — TRIAMCINOLONE ACETONIDE 10 MG/ML IJ SUSP
10.0000 mg | Freq: Once | INTRAMUSCULAR | Status: AC
  Administered 2022-04-16: 10 mg

## 2022-04-18 NOTE — Progress Notes (Signed)
Subjective:   Patient ID: Taylor Reynolds, female   DOB: 61 y.o.   MRN: [id number]   HPI Patient is developed a lot of pain in her right ankle on the outside and not remembering any specific injury   ROS      Objective:  Physical Exam  H&P intact with inflammation pain of the right sinus tarsi and into the lateral ankle gutter no loss of motion no crepitus     Assessment:  Appears to be inflammatory capsulitis of the right sinus tarsi and into the lateral ankle gutter     Plan:  H&P x-ray reviewed sterile prep injected the sinus tarsi right 3 mg Kenalog 5 mg Xylocaine and into the lateral ankle gutter.  Patient will be seen back as needed  X-rays indicate no signs of arthritis of the sinus tarsi with moderate flatfoot deformity noted

## 2022-04-21 ENCOUNTER — Other Ambulatory Visit: Payer: Self-pay | Admitting: Podiatry

## 2022-04-21 DIAGNOSIS — M779 Enthesopathy, unspecified: Secondary | ICD-10-CM

## 2022-04-22 DIAGNOSIS — Z1211 Encounter for screening for malignant neoplasm of colon: Secondary | ICD-10-CM | POA: Diagnosis not present

## 2022-04-22 DIAGNOSIS — Z86010 Personal history of colonic polyps: Secondary | ICD-10-CM | POA: Diagnosis not present

## 2022-05-05 ENCOUNTER — Encounter: Payer: Self-pay | Admitting: Student

## 2022-05-05 ENCOUNTER — Ambulatory Visit: Payer: Federal, State, Local not specified - PPO | Admitting: Student

## 2022-05-05 VITALS — BP 127/80 | HR 70 | Temp 98.0°F | Resp 16 | Ht 65.0 in | Wt 246.0 lb

## 2022-05-05 DIAGNOSIS — I5022 Chronic systolic (congestive) heart failure: Secondary | ICD-10-CM | POA: Diagnosis not present

## 2022-05-05 DIAGNOSIS — R0609 Other forms of dyspnea: Secondary | ICD-10-CM

## 2022-05-05 DIAGNOSIS — F17210 Nicotine dependence, cigarettes, uncomplicated: Secondary | ICD-10-CM | POA: Diagnosis not present

## 2022-05-05 DIAGNOSIS — Z8249 Family history of ischemic heart disease and other diseases of the circulatory system: Secondary | ICD-10-CM

## 2022-05-05 NOTE — Progress Notes (Signed)
Virtual Visit via Video Note   Subjective:   Taylor Reynolds, female    DOB: 1960/12/08, 61 y.o.   MRN: [id number]  Chief complaint:  Chief Complaint  Patient presents with   Dyspnea on exertion   Follow-up    1 year   Patient Education    HPI  61 y.o. AA female with morbid obesity, asthma, family history of coronary artery disease. She prefers to follow with our office for primary prevention given family history of CAD.   Patient has previously undergone echocardiogram which revealed preserved LVEF, mild LVH, and mild valvular disease.  She has also undergone low risk stress test.  Patient presents for annual follow-up.  She is feeling well overall without specific complaints today.  She walks regularly for as long as 2 hours without issue.  Mild bilateral leg edema remained stable. Patient denies chest pain, palpitations, syncope, near syncope, dizziness, orthopnea.  Past Medical History:  Diagnosis Date   Abnormal Pap smear 06/1989   Anal itching 05/2009   ASCUS (atypical squamous cells of undetermined significance) on Pap smear 2006   Asthma    Axillary mass, right 05/2008   Chest pressure    H/O pelvic mass 2003   History of irregular menstrual bleeding 02/2011   History of measles, mumps, or rubella    Leg edema 02/01/2019   Ovarian cyst 01/2002   Pelvic pain 12/2005   Reflux    Strain of shoulder, left 04/2006   Urge incontinence 2001   Varicella    Past Surgical History:  Procedure Laterality Date   ACNE CYST REMOVAL     Under eye   CESAREAN SECTION     Social History   Tobacco Use   Smoking status: Never   Smokeless tobacco: Never  Substance Use Topics   Alcohol use: No  Marital Status: Divorced  Family History  Problem Relation Age of Onset   Diabetes Father    Emphysema Sister    Stroke Maternal Grandmother    Heart disease Maternal Grandmother    Heart disease Maternal Grandfather    Asthma Maternal Grandfather    Diabetes Other    Breast  cancer Neg Hx    Current Outpatient Medications  Medication Instructions   albuterol (PROVENTIL HFA;VENTOLIN HFA) 108 (90 Base) MCG/ACT inhaler 1-2 puffs, Inhalation, Every 6 hours PRN   cyclobenzaprine (FLEXERIL) 10 mg, Oral, 3 times daily PRN   EPINEPHrine 0.3 mg/0.3 mL IJ SOAJ injection Intramuscular   ergocalciferol (VITAMIN D2) 1.25 MG (50000 UT) capsule Oral   meloxicam (MOBIC) 15 MG tablet No dose, route, or frequency recorded.   oxyCODONE-acetaminophen (PERCOCET/ROXICET) 5-325 MG tablet TAKE 1 TABLET BY MOUTH EVERY 6 HOURS AS NEEDED FOR PAIN FOR UP TO 5 DAYS.   Allergies  Allergen Reactions   Bee Venom Anaphylaxis   Peanut Allergen Powder-Dnfp Anaphylaxis   Peanuts [Peanut Oil] Anaphylaxis    Cardiovascular studies: EKG 05/05/2022: Sinus rhythm at a rate of 67 bpm.  Normal axis.  Poor R wave progression, cannot exclude anteroseptal infarct old.  No evidence of ischemia or underlying injury pattern.   PCV MYOCARDIAL PERFUSION WO LEXISCAN 03/02/2021 Exercise nuclear stress test was performed using Bruce protocol. Patient reached 4.7 METS, and 92% of age predicted maximum heart rate. Exercise capacity was low. Noi chest pain reported. Heart rate and hemodynamic response were normal. Stress EKG revealed no ischemic changes. Normal myocardial perfusion. Stress LVEF 62%. Low risk study.  PCV ECHOCARDIOGRAM COMPLETE 93/90/3009 Normal LV systolic function  with visual EF 60-65%. Left ventricle cavity is normal in size. Mild left ventricular hypertrophy. Normal global wall motion. Normal diastolic filling pattern, normal LAP. Left atrial cavity is mildly dilated. Right atrial cavity is mildly dilated. Mild (Grade I) mitral regurgitation. Mild to moderate tricuspid regurgitation. No evidence of pulmonary hypertension. Mild pulmonic regurgitation. Compared to study dated 10/16/2018 no significant change.   EKG 02/11/2021: Sinus rhythm at a rate of 75 bpm.  Normal axis.  Incomplete right  bundle branch block.   Lower Extremity Venous Duplex left leg 03/28/2019: No evidence of deep vein thrombosis of the left lower extremity with normal venous return. Tissue edema noted.   Echocardiogram 10/16/2018 1. Left ventricle cavity is normal in size. Mild concentric hypertrophy of the left ventricle. Normal global wall motion. Normal diastolic filling pattern. Calculated EF 65%. 2. Left atrial cavity is borderline dilated. 3. Mild (Grade I) mitral regurgitation. 4. Mild to moderate tricuspid regurgitation. No evidence of pulmonary hypertension.  Treadmill exercise stress test 10/13/2018: Indication: CP  The patient exercised on Bruce protocol for  7:00 min. Patient achieved  7.05 METS and reached HR  174 bpm, which is   106% of maximum age-predicted HR.  Stress test terminated due to Fatigue. Resting EKG demonstrates NSR, IRBBB. ST Changes: With peak exercise there was no ST-T changes of ischemia.   Arrhythmias: Occasional PVC . Chest Pain: none.  BP Response to Exercise: Resting hypertension 172/86- exaggerated response 252/56 mm Hg. HR Response to Exercise: Exaggerated HR response suggests decreased aerobic tolerence. Exercise capacity was below average for age. Recommendations: Continue primary/secondary prevention.  Recent labs:    Latest Ref Rng & Units 09/30/2021    8:37 PM 12/23/2020   10:47 PM 09/08/2018    1:07 PM  CMP  Glucose 70 - 99 mg/dL 115  105  89   BUN 6 - 20 mg/dL 14  11  10    Creatinine 0.44 - 1.00 mg/dL 1.11  0.84  0.90   Sodium 135 - 145 mmol/L 134  136  141   Potassium 3.5 - 5.1 mmol/L 4.4  3.7  4.0   Chloride 98 - 111 mmol/L 102  104  107   CO2 22 - 32 mmol/L 24  25    Calcium 8.9 - 10.3 mg/dL 9.8  8.7    Total Protein 6.5 - 8.1 g/dL 8.0  6.9    Total Bilirubin 0.3 - 1.2 mg/dL 0.9  0.5    Alkaline Phos 38 - 126 U/L 65  54    AST 15 - 41 U/L 24  18    ALT 0 - 44 U/L 24  18        Latest Ref Rng & Units 09/30/2021    8:37 PM 12/23/2020   10:47 PM  09/08/2018    1:07 PM  CBC  WBC 4.0 - 10.5 K/uL 7.3  4.0    Hemoglobin 12.0 - 15.0 g/dL 13.8  12.9  13.6   Hematocrit 36.0 - 46.0 % 43.3  39.3  40.0   Platelets 150 - 400 K/uL 265  207     Lipid Panel  No results found for: "CHOL", "TRIG", "HDL", "CHOLHDL", "VLDL", "LDLCALC", "LDLDIRECT" HEMOGLOBIN A1C No results found for: "HGBA1C", "MPG" TSH No results for input(s): "TSH" in the last 8760 hours.  External Labs:  04/05/2022: A1c 6.0% BUN 9, creatinine 1.06, GFR 60, sodium 138, potassium 4.1, AST 14, ALT 17  07/17/2021: Total cholesterol 192, triglycerides 52, HDL 88, LDL 94  09/23/2020:  A1c 6.0% Total cholesterol 216, triglycerides 52, HDL 95, LDL 112 Glucose 86, BUN 13, creatinine 0.86, GFR 75, sodium 143, potassium 4.2, alk phos 77, AST 16, ALT 16  02/20/2020:  Hemoglobin 13, hematocrit 38.7, MCV 86, platelet 228   Review of Systems  Cardiovascular:  Positive for leg swelling (mild, stable). Negative for chest pain, claudication, dyspnea on exertion, near-syncope, orthopnea, palpitations, paroxysmal nocturnal dyspnea and syncope.  Gastrointestinal:  Negative for melena.  Neurological:  Negative for dizziness.        Objective:   Vitals:   05/05/22 0906  BP: 127/80  Pulse: 70  Temp: 98 F (36.7 C)  Resp: 16  Height: 5' 5"  (1.651 m)  Weight: 246 lb (111.6 kg)  SpO2: 97%  BMI (Calculated): 40.94    Physical Exam Vitals reviewed.  Constitutional:      Appearance: She is obese.  Cardiovascular:     Rate and Rhythm: Normal rate and regular rhythm.     Pulses: Intact distal pulses.          Carotid pulses are 2+ on the right side and 2+ on the left side.      Radial pulses are 2+ on the right side and 2+ on the left side.       Dorsalis pedis pulses are 2+ on the right side and 2+ on the left side.       Posterior tibial pulses are 2+ on the right side and 2+ on the left side.     Heart sounds: S1 normal and S2 normal. No murmur heard.    No gallop.      Comments: Femoral and popliteal pulses difficult to evaluate due to patient body habitus. Pulmonary:     Effort: No respiratory distress.     Breath sounds: No wheezing, rhonchi or rales.  Musculoskeletal:     Right lower leg: Edema (minimal) present.     Left lower leg: Edema (minimal) present.  Neurological:     Mental Status: She is alert.       Assessment & Recommendations:     ICD-10-CM   1. Family history of early CAD  Z82.49     2. Dyspnea on exertion  R06.09 EKG 12-Lead    3. Morbid obesity ([place])  E66.01       61 y.o. AA female with morbid obesity, asthma, family history of coronary artery disease. She prefers to follow with our office for primary prevention given family history of CAD.   Primary prevention: Patient prefers to follow with our office regularly given family history of CAD.  She remains asymptomatic.  Blood pressure is well controlled.  I personally reviewed external labs, lipids are well controlled.  However did discuss with patient regarding the importance of diet and lifestyle modifications including weight loss. EKG and physical exam remained stable.  Dyspnea on exertion: No recurrence since last office visit.  Bilateral lower leg edema: Patient continues to have minimal bilateral lower leg edema which remains stable.  She reports this improves with elevation of the legs.  Encouraged conservative measures including support stockings.  Hypertension: Remains well controlled, managed per PCP.  Follow-up in 1 year, sooner if needed.   Alethia Berthold, PA-C 05/05/2022, 9:36 AM Office: [phone]

## 2022-05-12 ENCOUNTER — Ambulatory Visit: Payer: Federal, State, Local not specified - PPO | Admitting: Student

## 2022-05-13 ENCOUNTER — Ambulatory Visit: Payer: Federal, State, Local not specified - PPO | Admitting: Student

## 2022-05-14 ENCOUNTER — Ambulatory Visit: Payer: Federal, State, Local not specified - PPO | Admitting: Podiatry

## 2022-05-14 ENCOUNTER — Encounter: Payer: Self-pay | Admitting: Podiatry

## 2022-05-14 DIAGNOSIS — B353 Tinea pedis: Secondary | ICD-10-CM | POA: Diagnosis not present

## 2022-05-14 DIAGNOSIS — B351 Tinea unguium: Secondary | ICD-10-CM

## 2022-05-14 DIAGNOSIS — M79609 Pain in unspecified limb: Secondary | ICD-10-CM

## 2022-05-14 MED ORDER — CLOTRIMAZOLE 1 % EX CREA: TOPICAL_CREAM | CUTANEOUS | 1 refills | Status: DC

## 2022-05-18 NOTE — Progress Notes (Signed)
  Subjective:  Patient ID: Taylor Reynolds, female    DOB: July 25, 1961,  MRN: [id number]  Taylor Reynolds presents to clinic today for painful elongated mycotic toenails 1-5 bilaterally which are tender when wearing enclosed shoe gear. Pain is relieved with periodic professional debridement.  New problem(s): None.   PCP is Selinda Orion , and last visit was  April 05, 2022  Allergies  Allergen Reactions   Bee Venom Anaphylaxis   Peanut Allergen Powder-Dnfp Anaphylaxis   Peanut-Containing Drug Products Anaphylaxis   Peanuts [Peanut Oil] Anaphylaxis    Review of Systems: Negative except as noted in the HPI.  Objective: No changes noted in today's physical examination. Objective:   Vascular Examination: Vascular status intact b/l with palpable pedal pulses. Pedal hair present b/l. CFT immediate b/l. No pain with calf compression b/l. Skin temperature gradient WNL b/l. Trace edema b/l LE.  Neurological Examination: Sensation grossly intact b/l with 10 gram monofilament. Vibratory sensation intact b/l.   Dermatological Examination: Pedal skin with normal turgor, texture and tone b/l. Toenails 1-5 b/l thick, discolored, elongated with subungual debris and pain on dorsal palpation. No hyperkeratotic lesions noted b/l. Diffuse scaling noted peripherally and plantarly b/l feet.  No interdigital macerations.  No blisters, no weeping. No signs of secondary bacterial infection noted.  Musculoskeletal Examination: Normal muscle strength 5/5 to all lower extremity muscle groups bilaterally. Pes planus deformity noted bilateral LE.Marland Kitchen No pain, crepitus or joint limitation noted with ROM b/l LE.  Patient ambulates independently without assistive aids.  Radiographs: None  Assessment/Plan: 1. Pain due to onychomycosis of nail   2. Tinea pedis of both feet     -Patient was evaluated and treated. All patient's and/or POA's questions/concerns answered on today's  visit. -Toenails 1-5 b/l were debrided in length and girth with sterile nail nippers and dremel without iatrogenic bleeding.  -Rx refilled for Clotrimazole Cream 1% to be applied twice daily for six weeks. -Patient/POA to call should there be question/concern in the interim.   Return in about 3 months (around 08/14/2022).  Marzetta Board, DPM

## 2022-05-19 ENCOUNTER — Ambulatory Visit: Payer: Federal, State, Local not specified - PPO | Admitting: Cardiology

## 2022-05-20 DIAGNOSIS — R102 Pelvic and perineal pain: Secondary | ICD-10-CM | POA: Diagnosis not present

## 2022-05-20 DIAGNOSIS — M545 Low back pain, unspecified: Secondary | ICD-10-CM | POA: Diagnosis not present

## 2022-05-20 DIAGNOSIS — R319 Hematuria, unspecified: Secondary | ICD-10-CM | POA: Diagnosis not present

## 2022-06-01 DIAGNOSIS — D259 Leiomyoma of uterus, unspecified: Secondary | ICD-10-CM | POA: Diagnosis not present

## 2022-06-01 DIAGNOSIS — R102 Pelvic and perineal pain: Secondary | ICD-10-CM | POA: Diagnosis not present

## 2022-06-25 DIAGNOSIS — D122 Benign neoplasm of ascending colon: Secondary | ICD-10-CM | POA: Diagnosis not present

## 2022-06-25 DIAGNOSIS — K635 Polyp of colon: Secondary | ICD-10-CM | POA: Diagnosis not present

## 2022-06-25 DIAGNOSIS — Z1211 Encounter for screening for malignant neoplasm of colon: Secondary | ICD-10-CM | POA: Diagnosis not present

## 2022-06-25 DIAGNOSIS — D124 Benign neoplasm of descending colon: Secondary | ICD-10-CM | POA: Diagnosis not present

## 2022-08-20 DIAGNOSIS — Z01419 Encounter for gynecological examination (general) (routine) without abnormal findings: Secondary | ICD-10-CM | POA: Diagnosis not present

## 2022-08-20 DIAGNOSIS — Z124 Encounter for screening for malignant neoplasm of cervix: Secondary | ICD-10-CM | POA: Diagnosis not present

## 2022-08-20 DIAGNOSIS — R87810 Cervical high risk human papillomavirus (HPV) DNA test positive: Secondary | ICD-10-CM | POA: Diagnosis not present

## 2022-08-20 DIAGNOSIS — Z1239 Encounter for other screening for malignant neoplasm of breast: Secondary | ICD-10-CM | POA: Diagnosis not present

## 2022-08-20 DIAGNOSIS — Z6841 Body Mass Index (BMI) 40.0 and over, adult: Secondary | ICD-10-CM | POA: Diagnosis not present

## 2022-08-27 ENCOUNTER — Encounter: Payer: Self-pay | Admitting: Podiatry

## 2022-08-27 ENCOUNTER — Ambulatory Visit: Payer: Federal, State, Local not specified - PPO | Admitting: Podiatry

## 2022-08-27 DIAGNOSIS — M79609 Pain in unspecified limb: Secondary | ICD-10-CM

## 2022-08-27 DIAGNOSIS — B351 Tinea unguium: Secondary | ICD-10-CM | POA: Diagnosis not present

## 2022-08-31 DIAGNOSIS — E278 Other specified disorders of adrenal gland: Secondary | ICD-10-CM | POA: Insufficient documentation

## 2022-08-31 DIAGNOSIS — N281 Cyst of kidney, acquired: Secondary | ICD-10-CM | POA: Insufficient documentation

## 2022-08-31 DIAGNOSIS — M545 Low back pain, unspecified: Secondary | ICD-10-CM | POA: Diagnosis not present

## 2022-08-31 DIAGNOSIS — R1032 Left lower quadrant pain: Secondary | ICD-10-CM | POA: Diagnosis not present

## 2022-09-02 NOTE — Progress Notes (Signed)
  Subjective:  Patient ID: Taylor Reynolds, female    DOB: 1961-09-12,  MRN: [id number]  Taylor Reynolds presents to clinic today for painful elongated mycotic toenails 1-5 bilaterally which are tender when wearing enclosed shoe gear. Pain is relieved with periodic professional debridement.  Chief Complaint  Patient presents with   Nail Problem    Thick painful toenails, 3 month follow up   New problem(s): None.   PCP is Taylor Reynolds , and last visit was April 05, 2022.  Allergies  Allergen Reactions   Bee Venom Anaphylaxis   Peanut Allergen Powder-Dnfp Anaphylaxis   Peanut-Containing Drug Products Anaphylaxis   Peanuts [Peanut Oil] Anaphylaxis    Review of Systems: Negative except as noted in the HPI.  Objective:   Taylor Reynolds is a pleasant 61 y.o. female in NAD. AAO x 3.  Vascular Examination: Vascular status intact b/l with palpable pedal pulses. Pedal hair present b/l. CFT immediate b/l. No pain with calf compression b/l. Skin temperature gradient WNL b/l. Trace edema b/l LE.  Neurological Examination: Sensation grossly intact b/l with 10 gram monofilament. Vibratory sensation intact b/l.   Dermatological Examination: Pedal skin dry and flaky b/l. Toenails 1-5 b/l thick, discolored, elongated with subungual debris and pain on dorsal palpation. No hyperkeratotic lesions noted b/l. Diffuse scaling noted peripherally and plantarly b/l feet.  No interdigital macerations.  No blisters, no weeping. No signs of secondary bacterial infection noted.  Musculoskeletal Examination: Normal muscle strength 5/5 to all lower extremity muscle groups bilaterally. Pes planus deformity noted bilateral LE.Marland Kitchen No pain, crepitus or joint limitation noted with ROM b/l LE.  Patient ambulates independently without assistive aids.  Radiographs: None  Assessment/Plan: 1. Pain due to onychomycosis of nail     No orders of the defined types were placed in this  encounter.   -Consent given for treatment as described below: -Examined patient. -Continue diabetic foot care principles: inspect feet daily, monitor glucose as recommended by PCP and/or Endocrinologist, and follow prescribed diet per PCP, Endocrinologist and/or dietician. -Mycotic toenails 1-5 bilaterally were debrided in length and girth with sterile nail nippers and dremel without incident. -Patient instructed to apply moisturizer to feet once daily. She related understanding. -Patient/POA to call should there be question/concern in the interim.   Return in about 3 months (around 11/27/2022).  Marzetta Board, DPM

## 2022-09-07 DIAGNOSIS — R93429 Abnormal radiologic findings on diagnostic imaging of unspecified kidney: Secondary | ICD-10-CM | POA: Diagnosis not present

## 2022-09-07 DIAGNOSIS — E278 Other specified disorders of adrenal gland: Secondary | ICD-10-CM | POA: Diagnosis not present

## 2022-09-14 DIAGNOSIS — R87810 Cervical high risk human papillomavirus (HPV) DNA test positive: Secondary | ICD-10-CM | POA: Diagnosis not present

## 2022-09-14 DIAGNOSIS — B977 Papillomavirus as the cause of diseases classified elsewhere: Secondary | ICD-10-CM | POA: Diagnosis not present

## 2022-09-30 DIAGNOSIS — R93429 Abnormal radiologic findings on diagnostic imaging of unspecified kidney: Secondary | ICD-10-CM | POA: Diagnosis not present

## 2022-10-19 ENCOUNTER — Other Ambulatory Visit: Payer: Self-pay | Admitting: Internal Medicine

## 2022-10-19 ENCOUNTER — Other Ambulatory Visit: Payer: Self-pay | Admitting: Obstetrics and Gynecology

## 2022-10-19 ENCOUNTER — Encounter: Payer: Self-pay | Admitting: Obstetrics and Gynecology

## 2022-10-19 DIAGNOSIS — Z1231 Encounter for screening mammogram for malignant neoplasm of breast: Secondary | ICD-10-CM

## 2022-11-15 DIAGNOSIS — Z1330 Encounter for screening examination for mental health and behavioral disorders, unspecified: Secondary | ICD-10-CM | POA: Diagnosis not present

## 2022-11-15 DIAGNOSIS — Z1321 Encounter for screening for nutritional disorder: Secondary | ICD-10-CM | POA: Diagnosis not present

## 2022-11-15 DIAGNOSIS — E278 Other specified disorders of adrenal gland: Secondary | ICD-10-CM | POA: Diagnosis not present

## 2022-11-15 DIAGNOSIS — J4520 Mild intermittent asthma, uncomplicated: Secondary | ICD-10-CM | POA: Diagnosis not present

## 2022-11-15 DIAGNOSIS — Z13220 Encounter for screening for lipoid disorders: Secondary | ICD-10-CM | POA: Diagnosis not present

## 2022-11-15 DIAGNOSIS — Z6841 Body Mass Index (BMI) 40.0 and over, adult: Secondary | ICD-10-CM | POA: Diagnosis not present

## 2022-11-15 DIAGNOSIS — Z0000 Encounter for general adult medical examination without abnormal findings: Secondary | ICD-10-CM | POA: Diagnosis not present

## 2022-11-15 DIAGNOSIS — Z131 Encounter for screening for diabetes mellitus: Secondary | ICD-10-CM | POA: Diagnosis not present

## 2022-11-15 DIAGNOSIS — Z1329 Encounter for screening for other suspected endocrine disorder: Secondary | ICD-10-CM | POA: Diagnosis not present

## 2022-11-15 DIAGNOSIS — N281 Cyst of kidney, acquired: Secondary | ICD-10-CM | POA: Diagnosis not present

## 2022-11-15 DIAGNOSIS — Z130 Encounter for screening for diseases of the blood and blood-forming organs and certain disorders involving the immune mechanism: Secondary | ICD-10-CM | POA: Diagnosis not present

## 2022-12-06 DIAGNOSIS — G8929 Other chronic pain: Secondary | ICD-10-CM | POA: Diagnosis not present

## 2022-12-06 DIAGNOSIS — M545 Low back pain, unspecified: Secondary | ICD-10-CM | POA: Diagnosis not present

## 2022-12-06 DIAGNOSIS — R1032 Left lower quadrant pain: Secondary | ICD-10-CM | POA: Diagnosis not present

## 2022-12-11 IMAGING — CT CT ABD-PELV W/ CM
2 of 5 series · 16 of 46 positions shown, 18 images · IV contrast (Omnipaque)
Comparison: CT abdomen pelvis 03/08/2007

CLINICAL DATA: Bowel obstruction suspected. Pain naval area. Hx
urge incontinence, reflux, pelvic pain, ovarian cyst, pelvic mass,
ASCUS, and c-section.

EXAM:
CT ABDOMEN AND PELVIS WITH CONTRAST
TECHNIQUE: Multidetector CT imaging of the abdomen and pelvis was performed
using the standard protocol following bolus administration of
intravenous contrast.
CONTRAST:  100mL OMNIPAQUE IOHEXOL 300 MG/ML  SOLN

[Series 3: axial st · axial · 0.91mm/px · z∈[-636,-246]mm · 13 of 88 slices shown, 15 images]
[im 5/88  soft-tissue]
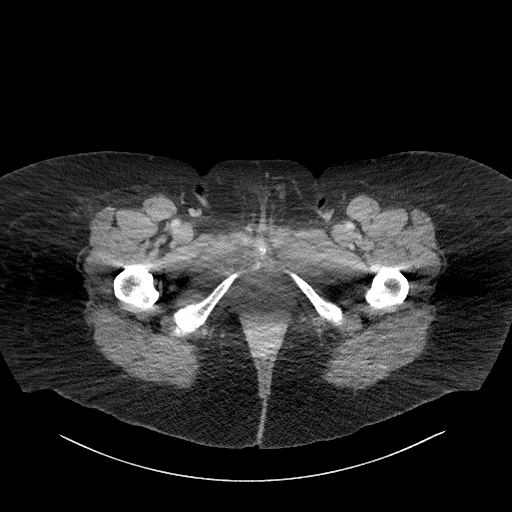
[im 5/88  bone]
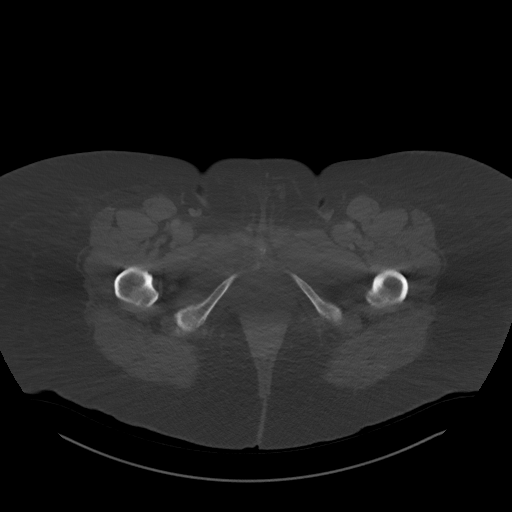
[im 14/88  soft-tissue]
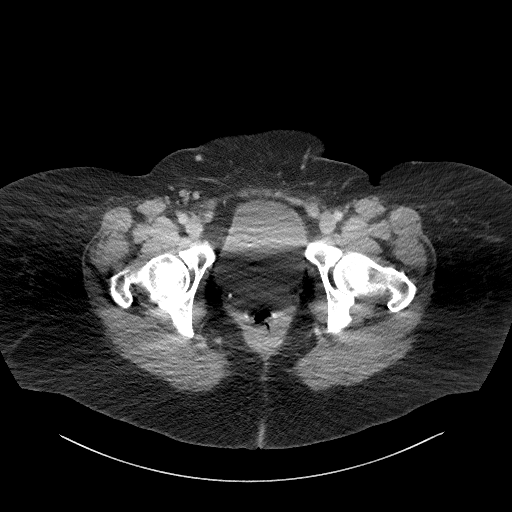
[im 18/88  soft-tissue]
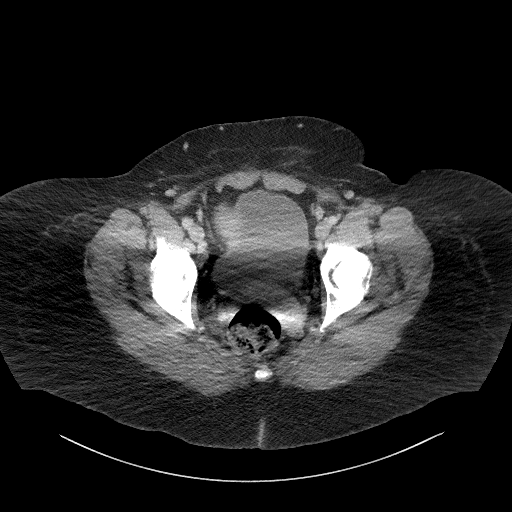
[im 27/88  soft-tissue]
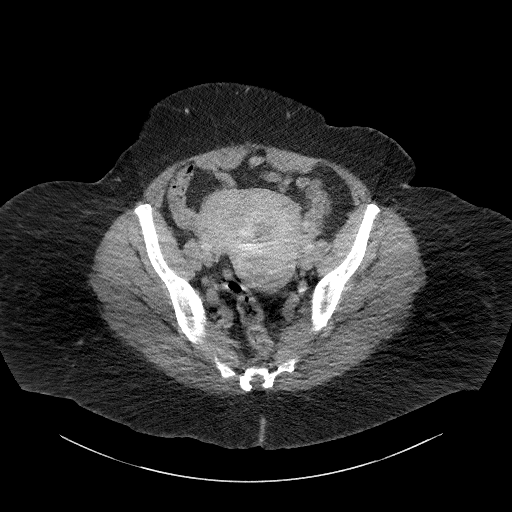
[im 31/88  soft-tissue]
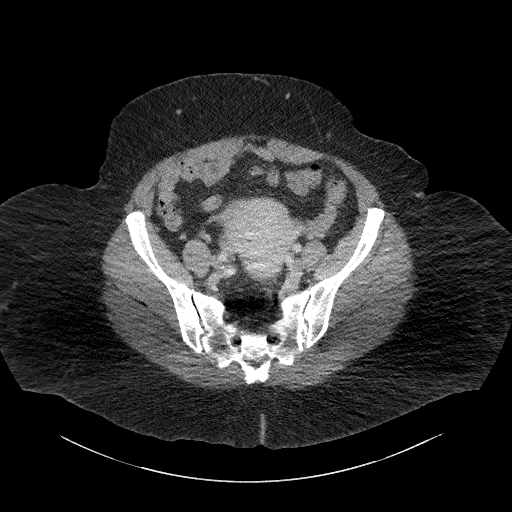
[im 40/88  soft-tissue]
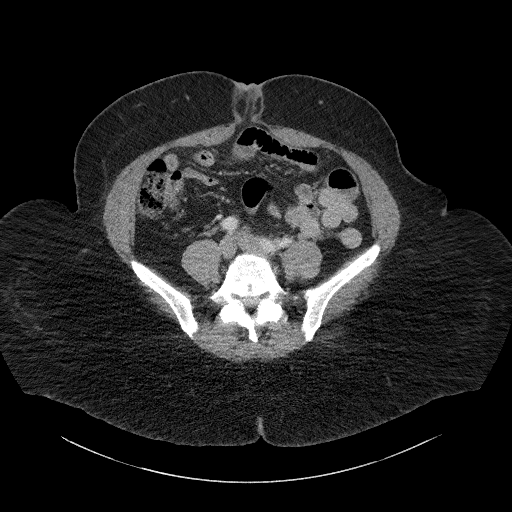
[im 44/88  soft-tissue]
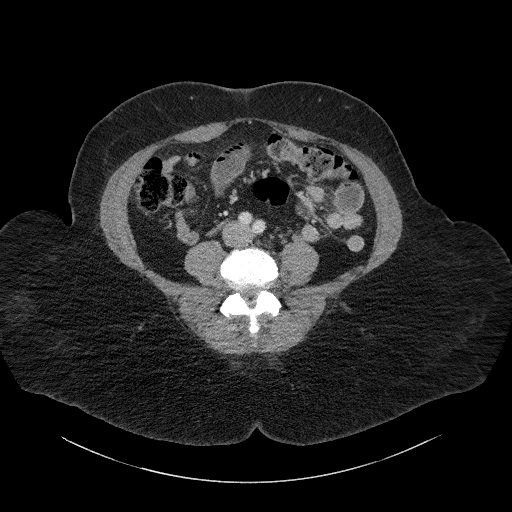
[im 48/88  soft-tissue]
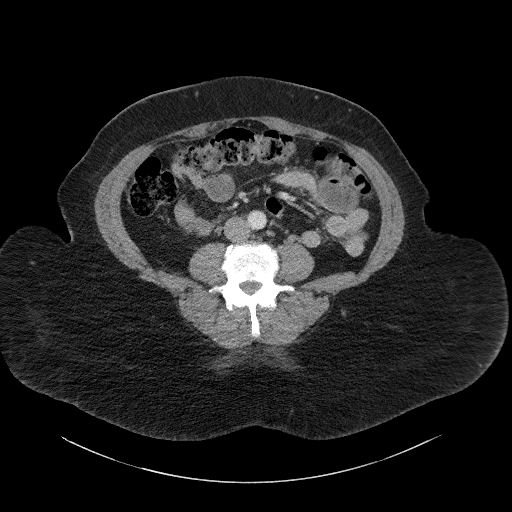
[im 57/88  soft-tissue]
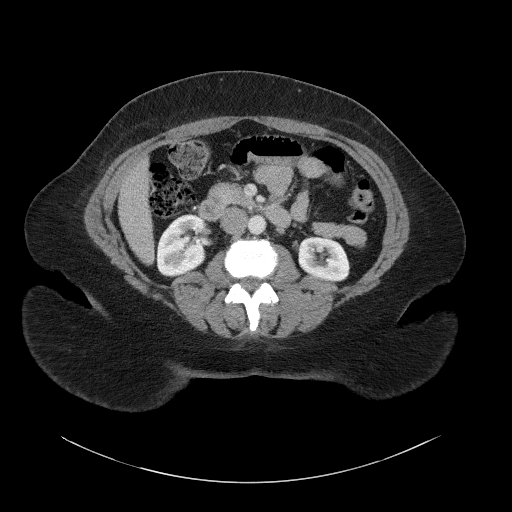
[im 57/88  bone]
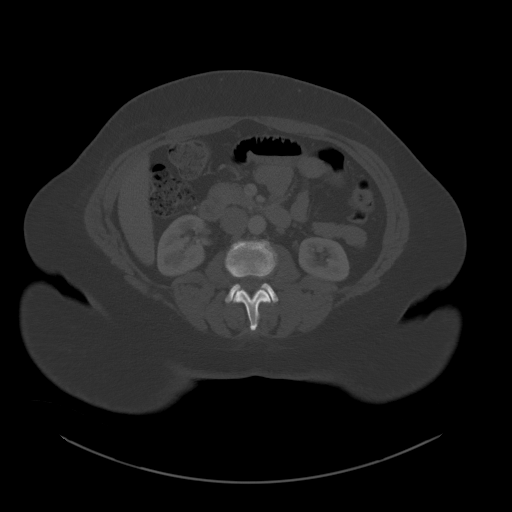
[im 61/88  soft-tissue]
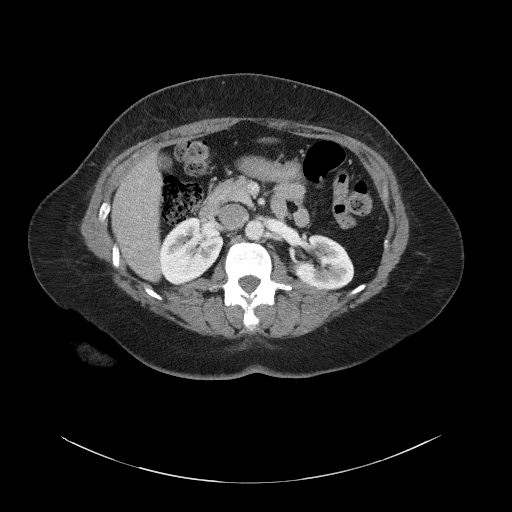
[im 70/88  soft-tissue]
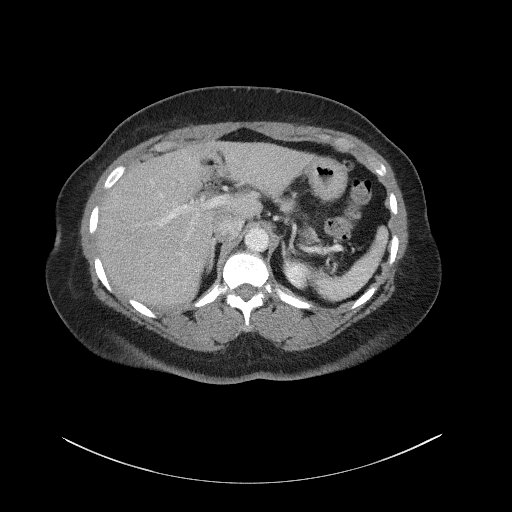
[im 74/88  soft-tissue]
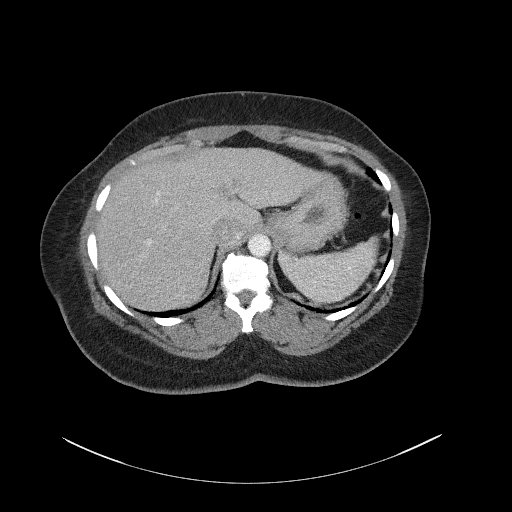
[im 83/88  soft-tissue]
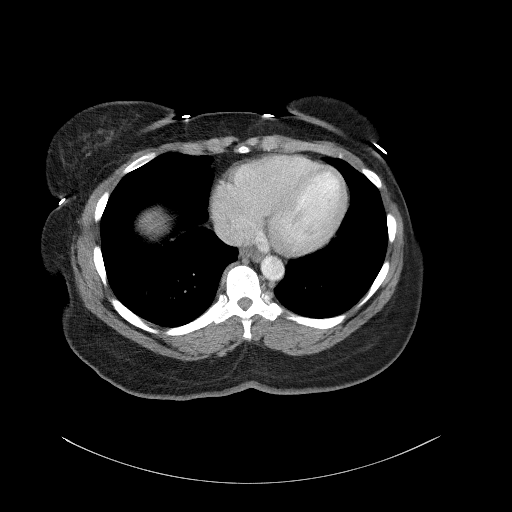

[Series 6: coronal st · coronal · 0.73mm/px · 3 of 100 slices shown]
[im 34/100  soft-tissue]
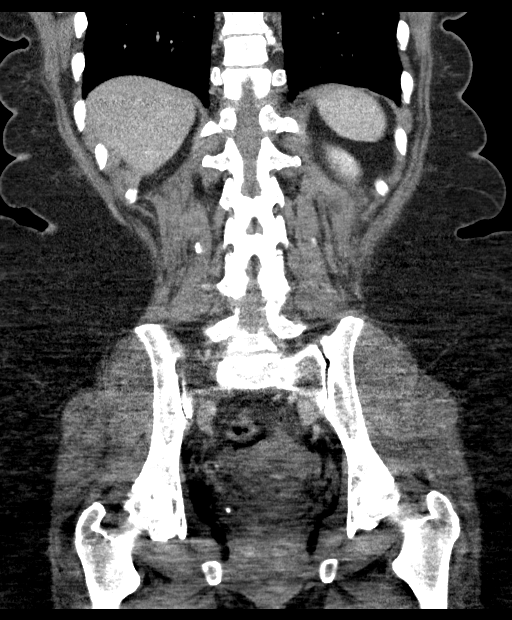
[im 45/100  soft-tissue]
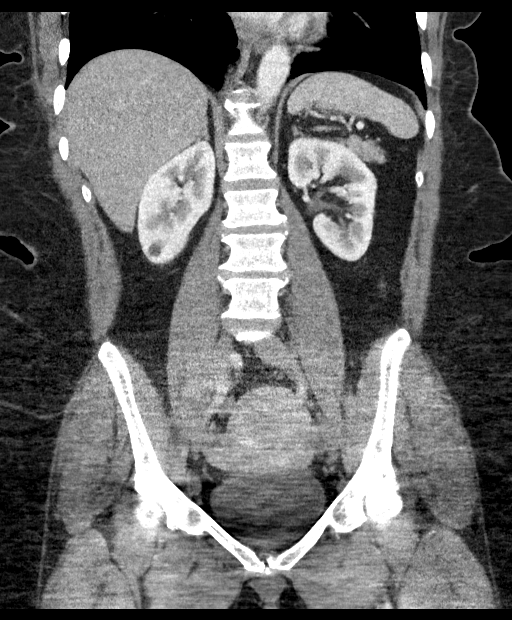
[im 56/100  soft-tissue]
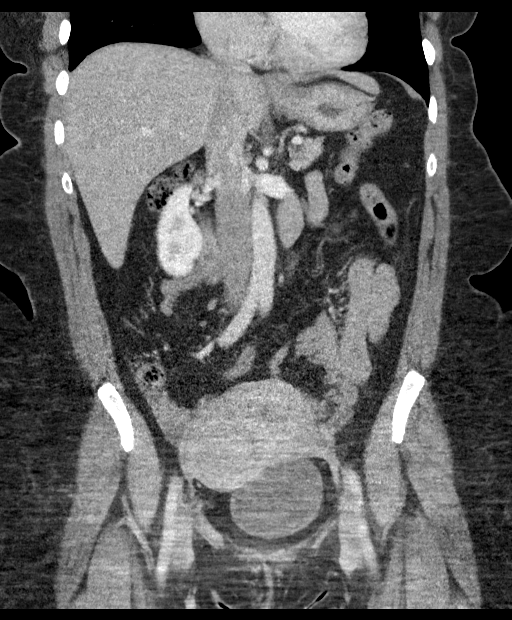

[16 of 46 positions shown; findings below may reference images not displayed]

FINDINGS: Lower chest: No acute abnormality.

Hepatobiliary: No focal liver abnormality. No gallstones,
gallbladder wall thickening, or pericholecystic fluid. No biliary
dilatation. No focal lesion. Normal pancreatic contour. No
surrounding inflammatory changes. No main pancreatic ductal
dilatation.

Pancreas: Normal in size without focal abnormality.

Spleen: Normal in size without focal abnormality.

Adrenals/Urinary Tract: No adrenal nodule bilaterally. Bilateral
kidneys enhance symmetrically. A 1.2 cm fluid density lesion likely
represents a simple renal cyst. No hydronephrosis. No hydroureter.
The urinary bladder is unremarkable.

Stomach/Bowel: Stomach is within normal limits. No evidence of bowel
wall thickening or dilatation. Appendix appears normal.

Vascular/Lymphatic: No abdominal aorta or iliac aneurysm. No
abdominal, pelvic, or inguinal lymphadenopathy.

Reproductive: Round, approximately 6 cm, intramural, right fundal
uterine lesion likely representing a fibroid. Otherwise the uterus
and bilateral adnexal regions are unremarkable.

Other: No intraperitoneal free gas. No organized fluid collection.

Musculoskeletal:

Tiny fat containing umbilical hernia with an abdominal defect of
cm. Vague fat stranding/trace free fluid noted within the umbilical
hernia ([DATE], [DATE]).

No suspicious lytic or blastic osseous lesions. No acute displaced
fracture. Multilevel degenerative changes of the spine.
IMPRESSION: 1. Tiny fat containing umbilical hernia with an abdominal defect of
0.8 cm. Vague fat stranding/trace free fluid within the umbilical
hernia. No bowel obstruction associated with the hernia. Correlate
clinically with incarceration.
2. An approximately 6 mm uterine fibroid. Consider pelvic ultrasound
for further evaluation.

## 2022-12-15 ENCOUNTER — Ambulatory Visit (INDEPENDENT_AMBULATORY_CARE_PROVIDER_SITE_OTHER): Payer: Federal, State, Local not specified - PPO | Admitting: Podiatry

## 2022-12-15 DIAGNOSIS — Z1211 Encounter for screening for malignant neoplasm of colon: Secondary | ICD-10-CM | POA: Insufficient documentation

## 2022-12-15 DIAGNOSIS — Z91199 Patient's noncompliance with other medical treatment and regimen due to unspecified reason: Secondary | ICD-10-CM

## 2022-12-15 NOTE — Progress Notes (Signed)
1. No-show for appointment

## 2022-12-20 ENCOUNTER — Ambulatory Visit: Payer: Federal, State, Local not specified - PPO

## 2022-12-24 ENCOUNTER — Other Ambulatory Visit: Payer: Self-pay | Admitting: Obstetrics and Gynecology

## 2022-12-24 DIAGNOSIS — Z1231 Encounter for screening mammogram for malignant neoplasm of breast: Secondary | ICD-10-CM

## 2022-12-28 ENCOUNTER — Ambulatory Visit
Admission: RE | Admit: 2022-12-28 | Discharge: 2022-12-28 | Disposition: A | Discharge status: Home / Self Care | Payer: Federal, State, Local not specified - PPO | Source: Ambulatory Visit | Attending: Obstetrics and Gynecology | Admitting: Obstetrics and Gynecology

## 2022-12-28 DIAGNOSIS — Z1231 Encounter for screening mammogram for malignant neoplasm of breast: Secondary | ICD-10-CM | POA: Diagnosis not present

## 2023-01-03 DIAGNOSIS — R1032 Left lower quadrant pain: Secondary | ICD-10-CM | POA: Diagnosis not present

## 2023-01-03 DIAGNOSIS — M545 Low back pain, unspecified: Secondary | ICD-10-CM | POA: Diagnosis not present

## 2023-01-03 DIAGNOSIS — G8929 Other chronic pain: Secondary | ICD-10-CM | POA: Diagnosis not present

## 2023-05-04 DIAGNOSIS — N281 Cyst of kidney, acquired: Secondary | ICD-10-CM | POA: Diagnosis not present

## 2023-05-06 ENCOUNTER — Ambulatory Visit: Payer: Federal, State, Local not specified - PPO | Admitting: Cardiology

## 2023-05-06 ENCOUNTER — Encounter: Payer: Self-pay | Admitting: Cardiology

## 2023-05-06 VITALS — BP 131/83 | HR 71 | Resp 16 | Ht 65.0 in | Wt 248.0 lb

## 2023-05-06 DIAGNOSIS — Z8249 Family history of ischemic heart disease and other diseases of the circulatory system: Secondary | ICD-10-CM

## 2023-05-06 DIAGNOSIS — I872 Venous insufficiency (chronic) (peripheral): Secondary | ICD-10-CM | POA: Diagnosis not present

## 2023-05-06 DIAGNOSIS — I10 Essential (primary) hypertension: Secondary | ICD-10-CM | POA: Diagnosis not present

## 2023-05-06 NOTE — Progress Notes (Addendum)
Virtual Visit via Video Note   Subjective:   Taylor Reynolds, female    DOB: 01-22-1961, 62 y.o.   MRN: [id number]  Chief complaint:  Chief Complaint  Patient presents with   Family history of early CAD   Follow-up    1 year    HPI  62 y.o. African American female with obesity, asthma, family history of coronary artery disease. She prefers to follow with our office for primary prevention given family history of CAD.   She denies chest pain, shortness of breath, palpitations, orthopnea, PND, TIA/syncope. She has leg edema and fatigue in both legs.     Current Outpatient Medications:    albuterol (PROVENTIL HFA;VENTOLIN HFA) 108 (90 Base) MCG/ACT inhaler, Inhale 1-2 puffs into the lungs every 6 (six) hours as needed for wheezing or shortness of breath., Disp: 1 Inhaler, Rfl: 0   EPINEPHrine 0.3 mg/0.3 mL IJ SOAJ injection, Inject into the muscle., Disp: , Rfl:    ergocalciferol (VITAMIN D2) 1.25 MG (50000 UT) capsule, Take by mouth., Disp: , Rfl:     Cardiovascular studies:  EKG 05/06/2023: Sinus rhythm 71 bpm with sinus arrhythmia  PCV MYOCARDIAL PERFUSION WO LEXISCAN 03/02/2021 Exercise nuclear stress test was performed using Bruce protocol. Patient reached 4.7 METS, and 92% of age predicted maximum heart rate. Exercise capacity was low. Noi chest pain reported. Heart rate and hemodynamic response were normal. Stress EKG revealed no ischemic changes. Normal myocardial perfusion. Stress LVEF 62%. Low risk study.  PCV ECHOCARDIOGRAM COMPLETE 02/17/2021 Normal LV systolic function with visual EF 60-65%. Left ventricle cavity is normal in size. Mild left ventricular hypertrophy. Normal global wall motion. Normal diastolic filling pattern, normal LAP. Left atrial cavity is mildly dilated. Right atrial cavity is mildly dilated. Mild (Grade I) mitral regurgitation. Mild to moderate tricuspid regurgitation. No evidence of pulmonary hypertension. Mild pulmonic  regurgitation. Compared to study dated 10/16/2018 no significant change.  Lower Extremity Venous Duplex left leg 03/28/2019: No evidence of deep vein thrombosis of the left lower extremity with normal venous return. Tissueedema noted.   Echocardiogram 10/16/2018 1. Left ventricle cavity is normal in size. Mild concentric hypertrophy of the left ventricle. Normal global wall motion. Normal diastolic filling pattern. Calculated EF 65%. 2. Left atrial cavity is borderline dilated. 3. Mild (Grade I) mitral regurgitation. 4. Mild to moderate tricuspid regurgitation. No evidence of pulmonary hypertension.  Treadmill exercise stress test 10/13/2018: Indication: CP  The patient exercised on Bruce protocol for  7:00 min. Patient achieved  7.05 METS and reached HR  174 bpm, which is   106% of maximum age-predicted HR.  Stress test terminated due to Fatigue. Resting EKG demonstrates NSR, IRBBB. ST Changes: With peak exercise there was no ST-T changes of ischemia.   Arrhythmias: Occasional PVC . Chest Pain: none.  BP Response to Exercise: Resting hypertension 172/86- exaggerated response 252/56 mm Hg. HR Response to Exercise: Exaggerated HR response suggests decreased aerobic tolerence. Exercise capacity was below average for age. Recommendations: Continue primary/secondary prevention.  Recent labs: 10/26/2022: Glucose 85, BUN/Cr 12/0.98. EGFR 57. Na/K 134/4.4. Albumin 3.3. Rest of the CMP normal H/H 13/43. MCV 88. Platelets 265 HbA1C 6.0% Chol 194, TG 49, HDL 85, LDL 100 TSH 0.8 normal   Review of Systems  Cardiovascular:  Positive for leg swelling (mild, stable). Negative for chest pain, claudication, dyspnea on exertion, near-syncope, orthopnea, palpitations, paroxysmal nocturnal dyspnea and syncope.  Gastrointestinal:  Negative for melena.  Neurological:  Negative for dizziness.  Objective:   Vitals:   05/06/23 1022  BP: 131/83  Pulse: 71  Resp: 16  Height: 5\' 5"  (1.651  m)  Weight: 248 lb (112.5 kg)  SpO2: 98%  BMI (Calculated): 41.27     Physical Exam Vitals reviewed.  Constitutional:      Appearance: She is obese.  Cardiovascular:     Rate and Rhythm: Normal rate and regular rhythm.     Pulses: Intact distal pulses.          Carotid pulses are 2+ on the right side and 2+ on the left side.      Radial pulses are 2+ on the right side and 2+ on the left side.       Dorsalis pedis pulses are 2+ on the right side and 2+ on the left side.       Posterior tibial pulses are 2+ on the right side and 2+ on the left side.     Heart sounds: S1 normal and S2 normal. No murmur heard.    No gallop.  Pulmonary:     Effort: No respiratory distress.     Breath sounds: No wheezing, rhonchi or rales.  Musculoskeletal:     Right lower leg: Edema (minimal) present.     Left lower leg: Edema (minimal) present.  Neurological:     Mental Status: She is alert.    Visit diagnoses:   ICD-10-CM   1. Venous insufficiency of both lower extremities  I87.2 Ambulatory referral to Vascular Surgery    2. Family history of early CAD  Z89.49 CT CARDIAC SCORING (SELF PAY ONLY)          Assessment & Recommendations:    62 y.o. AA female with morbid obesity, asthma, family history of coronary artery disease. She prefers to follow with our office for primary prevention given family history of CAD.   Primary prevention: Recommend CT cardiac scoring scan for risk stratification.  Bilateral lower leg edema: Prominent varicocities in both legs and leg edema persist in spite of regular usage of compression stockings. While some of her pain symptoms could be arthritic, I reckon some component may be due to possible venous insufficiency. Referred to [facility] for evaluation and management.   Hypertension: Controlled.  F/u in 1 year   [employer], PA-C 05/06/2023, 10:10 AM Office: [phone]

## 2023-05-16 ENCOUNTER — Ambulatory Visit (HOSPITAL_COMMUNITY)
Admission: RE | Admit: 2023-05-16 | Discharge: 2023-05-16 | Disposition: A | Discharge status: Home / Self Care | Payer: Federal, State, Local not specified - PPO | Source: Ambulatory Visit | Attending: Cardiology | Admitting: Cardiology

## 2023-05-16 DIAGNOSIS — Z8249 Family history of ischemic heart disease and other diseases of the circulatory system: Secondary | ICD-10-CM | POA: Insufficient documentation

## 2023-05-31 ENCOUNTER — Encounter: Payer: Self-pay | Admitting: Podiatry

## 2023-05-31 ENCOUNTER — Ambulatory Visit (INDEPENDENT_AMBULATORY_CARE_PROVIDER_SITE_OTHER): Payer: Federal, State, Local not specified - PPO | Admitting: Podiatry

## 2023-05-31 DIAGNOSIS — M79609 Pain in unspecified limb: Secondary | ICD-10-CM | POA: Diagnosis not present

## 2023-05-31 DIAGNOSIS — B351 Tinea unguium: Secondary | ICD-10-CM

## 2023-06-05 NOTE — Progress Notes (Signed)
  Subjective:  Patient ID: Taylor Reynolds, female    DOB: [birth year],  MRN: [id number]  Taylor Reynolds presents to clinic today for: painful thick toenails that are difficult to trim. Pain interferes with ambulation. Aggravating factors include wearing enclosed shoe gear. Pain is relieved with periodic professional debridement.  Chief Complaint  Patient presents with   RFC    RFC    PCP is Teena Irani, PA-C.  Allergies  Allergen Reactions   Bee Venom Anaphylaxis   Peanut Allergen Powder-Dnfp Anaphylaxis   Peanut-Containing Drug Products Anaphylaxis   Peanuts [Peanut Oil] Anaphylaxis    Review of Systems: Negative except as noted in the HPI.  Objective: No changes noted in today's physical examination. There were no vitals filed for this visit.  Taylor Reynolds is a pleasant 62 y.o. female in NAD. AAO x 3.  Vascular Examination: Capillary refill time <3 seconds b/l LE. Palpable pedal pulses b/l LE. Digital hair present b/l. Skin temperature gradient WNL b/l. No varicosities b/l. Trace edema noted BLE. No cyanosis or clubbing noted b/l LE.Marland Kitchen  Dermatological Examination: Pedal skin with normal turgor, texture and tone b/l. No open wounds. No interdigital macerations b/l. Toenails 1-5 b/l thickened, discolored, dystrophic with subungual debris. There is pain on palpation to dorsal aspect of nailplates. No corns, calluses nor porokeratotic lesions noted..  Neurological Examination: Protective sensation intact with 10 gram monofilament b/l LE. Vibratory sensation intact b/l LE.   Musculoskeletal Examination: Muscle strength 5/5 to all lower extremity muscle groups bilaterally. Pes planus deformity noted bilateral LE. Patient ambulates independent of any assistive aids.  Assessment/Plan: 1. Pain due to onychomycosis of nail     Patient was evaluated and treated. All patient's and/or POA's questions/concerns addressed on today's visit. Toenails 1-5  debrided in length and girth without incident. Continue soft, supportive shoe gear daily. Report any pedal injuries to medical professional. Call office if there are any questions/concerns. -Patient/POA to call should there be question/concern in the interim.   Return in about 3 months (around 08/31/2023).  Freddie Breech, DPM

## 2023-06-28 ENCOUNTER — Encounter (HOSPITAL_COMMUNITY): Payer: Federal, State, Local not specified - PPO

## 2023-06-28 ENCOUNTER — Encounter: Payer: Federal, State, Local not specified - PPO | Admitting: Vascular Surgery

## 2023-06-29 ENCOUNTER — Other Ambulatory Visit: Payer: Self-pay | Admitting: *Deleted

## 2023-06-29 DIAGNOSIS — R600 Localized edema: Secondary | ICD-10-CM

## 2023-07-06 ENCOUNTER — Encounter: Payer: Self-pay | Admitting: Vascular Surgery

## 2023-07-06 ENCOUNTER — Ambulatory Visit: Payer: Federal, State, Local not specified - PPO | Admitting: Vascular Surgery

## 2023-07-06 ENCOUNTER — Ambulatory Visit (HOSPITAL_COMMUNITY)
Admission: RE | Admit: 2023-07-06 | Discharge: 2023-07-06 | Disposition: A | Discharge status: Home / Self Care | Payer: Federal, State, Local not specified - PPO | Source: Ambulatory Visit | Attending: Vascular Surgery | Admitting: Vascular Surgery

## 2023-07-06 VITALS — BP 121/69 | HR 63 | Temp 97.4°F | Resp 18 | Ht 63.5 in | Wt 244.9 lb

## 2023-07-06 DIAGNOSIS — R600 Localized edema: Secondary | ICD-10-CM | POA: Insufficient documentation

## 2023-07-06 DIAGNOSIS — I872 Venous insufficiency (chronic) (peripheral): Secondary | ICD-10-CM | POA: Diagnosis not present

## 2023-07-06 DIAGNOSIS — I890 Lymphedema, not elsewhere classified: Secondary | ICD-10-CM | POA: Diagnosis not present

## 2023-07-06 NOTE — Progress Notes (Signed)
ASSESSMENT & PLAN   COMBINED CHRONIC VENOUS INSUFFICIENCY AND LYMPHEDEMA: Based on her exam and duplex findings I suspect she has combined chronic venous insufficiency and lymphedema.  Her venous insufficiency is limited to the deep system.  She has nonpitting edema consistent with lymphedema so so I suspect she has lymphedema secondary to her chronic venous insufficiency.  Regardless, the treatment is exactly the same.  I have encouraged her to avoid prolonged sitting and standing.  We have discussed the importance of exercise specifically walking and water aerobics.  I have encouraged her to wear knee-high compression stockings at work since she sits and stands for a long period of time.  We discussed the importance of daily leg elevation and the proper positioning for this.  In addition we discussed the importance of maintaining a healthy weight as central obesity especially increases lower extremity venous pressure.  I reassured her that she has no evidence of arterial insufficiency.  Will be happy to see her back in any time if her swelling or symptoms progress.  REASON FOR CONSULT:    Venous insufficiency.  The consult is requested by Dr. Rosemary Holms.  HPI:   Taylor Reynolds is a 62 y.o. female who presents with a chief complaint of bilateral lower extremity swelling.  She notes that the swelling is more significant on the left side.  She states that she has had swelling for about a year.  This came on gradually.  Of note she works at the [employer] all night.  She stands for 4 hours and sits for 4 hours.  When she goes home in the morning she stays in her car will wait waiting to pick up her grandchild.  Thus her legs are dependent a fair amount.  She does occasionally wear compression stockings.  She does not routinely elevate her legs.  The swelling is nontender.  She does not describe any aching pain or heaviness in her legs typically.  She denies any history of claudication or rest  pain.  Past Medical History:  Diagnosis Date   Abnormal Pap smear 06/1989   Anal itching 05/2009   ASCUS (atypical squamous cells of undetermined significance) on Pap smear 2006   Asthma    Axillary mass, right 05/2008   Chest pressure    H/O pelvic mass 2003   History of irregular menstrual bleeding 02/2011   History of measles, mumps, or rubella    Leg edema 02/01/2019   Ovarian cyst 01/2002   Pelvic pain 12/2005   Reflux    Strain of shoulder, left 04/2006   Urge incontinence 2001   Varicella     Family History  Problem Relation Age of Onset   Diabetes Father    Emphysema Sister    Stroke Maternal Grandmother    Heart disease Maternal Grandmother    Heart disease Maternal Grandfather    Asthma Maternal Grandfather    Diabetes Other    Breast cancer Neg Hx     SOCIAL HISTORY: Social History   Tobacco Use   Smoking status: Never   Smokeless tobacco: Never  Substance Use Topics   Alcohol use: No    Allergies  Allergen Reactions   Bee Venom Anaphylaxis   Peanut Allergen Powder-Dnfp Anaphylaxis   Peanut-Containing Drug Products Anaphylaxis   Peanuts [Peanut Oil] Anaphylaxis    Current Outpatient Medications  Medication Sig Dispense Refill   albuterol (PROVENTIL HFA;VENTOLIN HFA) 108 (90 Base) MCG/ACT inhaler Inhale 1-2 puffs into the lungs every 6 (  six) hours as needed for wheezing or shortness of breath. 1 Inhaler 0   ergocalciferol (VITAMIN D2) 1.25 MG (50000 UT) capsule Take by mouth.     EPINEPHrine 0.3 mg/0.3 mL IJ SOAJ injection Inject into the muscle. (Patient not taking: Reported on 07/06/2023)     No current facility-administered medications for this visit.    REVIEW OF SYSTEMS:  [X]  denotes positive finding, [ ]  denotes negative finding Cardiac  Comments:  Chest pain or chest pressure:    Shortness of breath upon exertion:    Short of breath when lying flat:    Irregular heart rhythm:        Vascular    Pain in calf, thigh, or hip brought on by  ambulation: x   Pain in feet at night that wakes you up from your sleep:     Blood clot in your veins:    Leg swelling:  x       Pulmonary    Oxygen at home:    Productive cough:     Wheezing:         Neurologic    Sudden weakness in arms or legs:     Sudden numbness in arms or legs:     Sudden onset of difficulty speaking or slurred speech:    Temporary loss of vision in one eye:     Problems with dizziness:         Gastrointestinal    Blood in stool:     Vomited blood:         Genitourinary    Burning when urinating:     Blood in urine:        Psychiatric    Major depression:         Hematologic    Bleeding problems:    Problems with blood clotting too easily:        Skin    Rashes or ulcers:        Constitutional    Fever or chills:    -  PHYSICAL EXAM:   Vitals:   07/06/23 1529  BP: 121/69  Pulse: 63  Resp: 18  Temp: (!) 97.4 F (36.3 C)  TempSrc: Temporal  SpO2: 99%  Weight: 244 lb 14.4 oz (111.1 kg)  Height: 5' 3.5" (1.613 m)   Body mass index is 42.7 kg/m. GENERAL: The patient is a well-nourished female, in no acute distress. The vital signs are documented above. CARDIAC: There is a regular rate and rhythm.  VASCULAR: I do not detect carotid bruits. She has palpable pedal pulses. She has bilateral lower extremity swelling with some nonpitting edema consistent with lymphedema.  She does have some telangiectasias and reticular veins bilaterally.  She does not have any large varicose veins. PULMONARY: There is good air exchange bilaterally without wheezing or rales. ABDOMEN: Soft and non-tender with normal pitched bowel sounds.  MUSCULOSKELETAL: There are no major deformities. NEUROLOGIC: No focal weakness or paresthesias are detected. SKIN: There are no ulcers or rashes noted. PSYCHIATRIC: The patient has a normal affect.  DATA:    VENOUS DUPLEX: I have independently interpreted the patient's venous duplex scan today.  This was of the left  lower extremity only.  There was no evidence of DVT.  There was deep venous reflux in the common femoral vein and popliteal vein.  There was no significant superficial venous reflux.  Waverly Ferrari [facility]

## 2023-09-05 ENCOUNTER — Ambulatory Visit: Payer: Federal, State, Local not specified - PPO | Admitting: Podiatry

## 2023-09-26 ENCOUNTER — Ambulatory Visit: Payer: Federal, State, Local not specified - PPO | Admitting: Podiatry

## 2023-11-19 IMAGING — MG MM DIGITAL SCREENING BILAT W/ TOMO AND CAD
6 of 10 series · 6 of 30 positions shown · non-contrast
Comparison: Previous exam(s).

CLINICAL DATA: Screening.

EXAM:
DIGITAL SCREENING BILATERAL MAMMOGRAM WITH TOMOSYNTHESIS AND CAD
TECHNIQUE: Bilateral screening digital craniocaudal and mediolateral oblique
mammograms were obtained. Bilateral screening digital breast
tomosynthesis was performed. The images were evaluated with
computer-aided detection.

[L CC synth-2D]
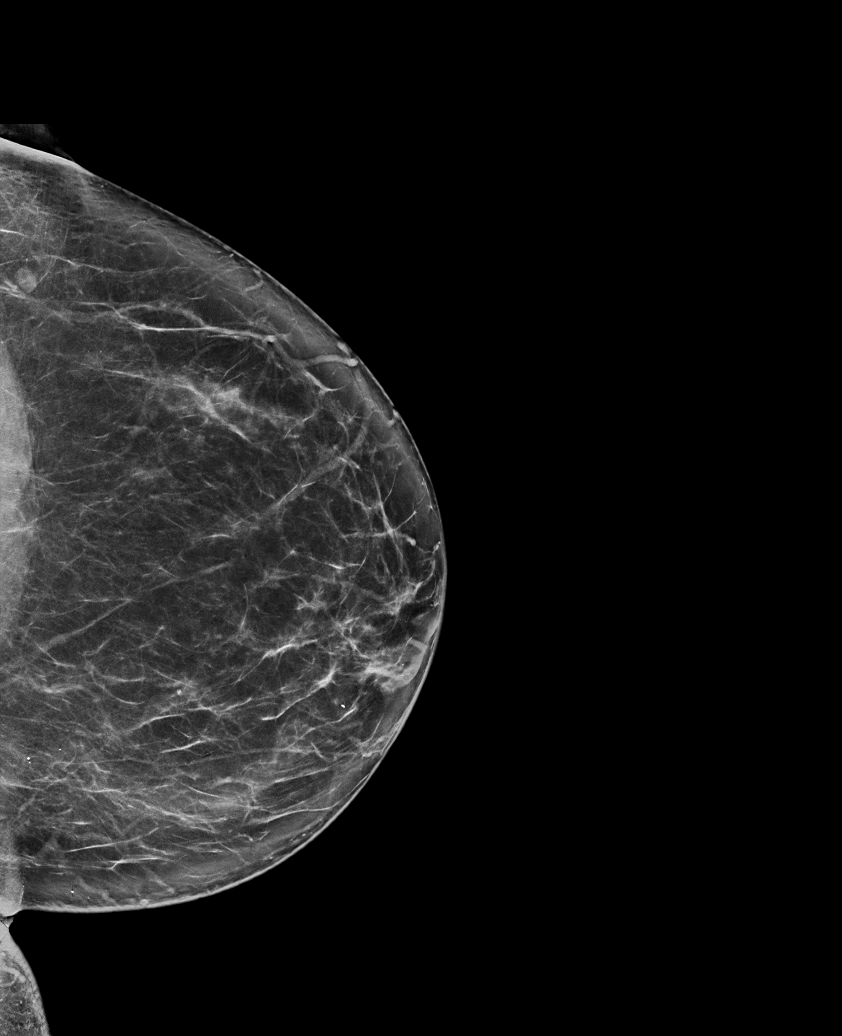

[L MLO synth-2D (1 of 2)]
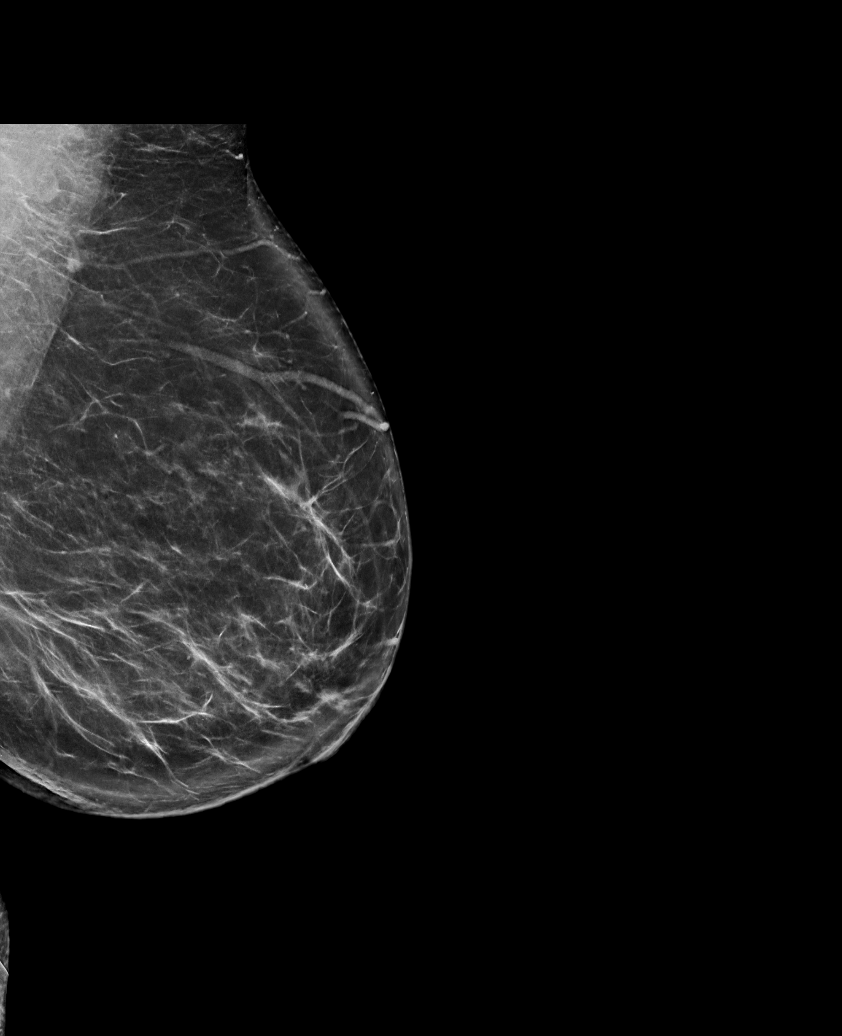

[R CC synth-2D]
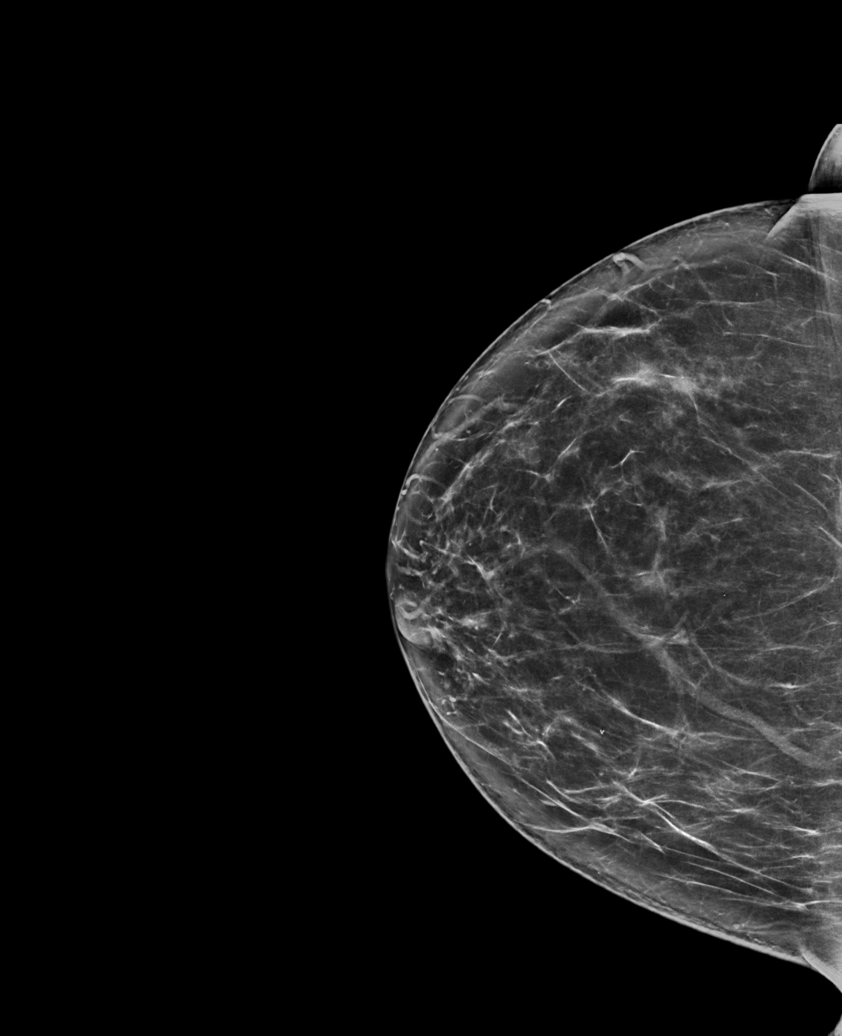

[R MLO synth-2D]
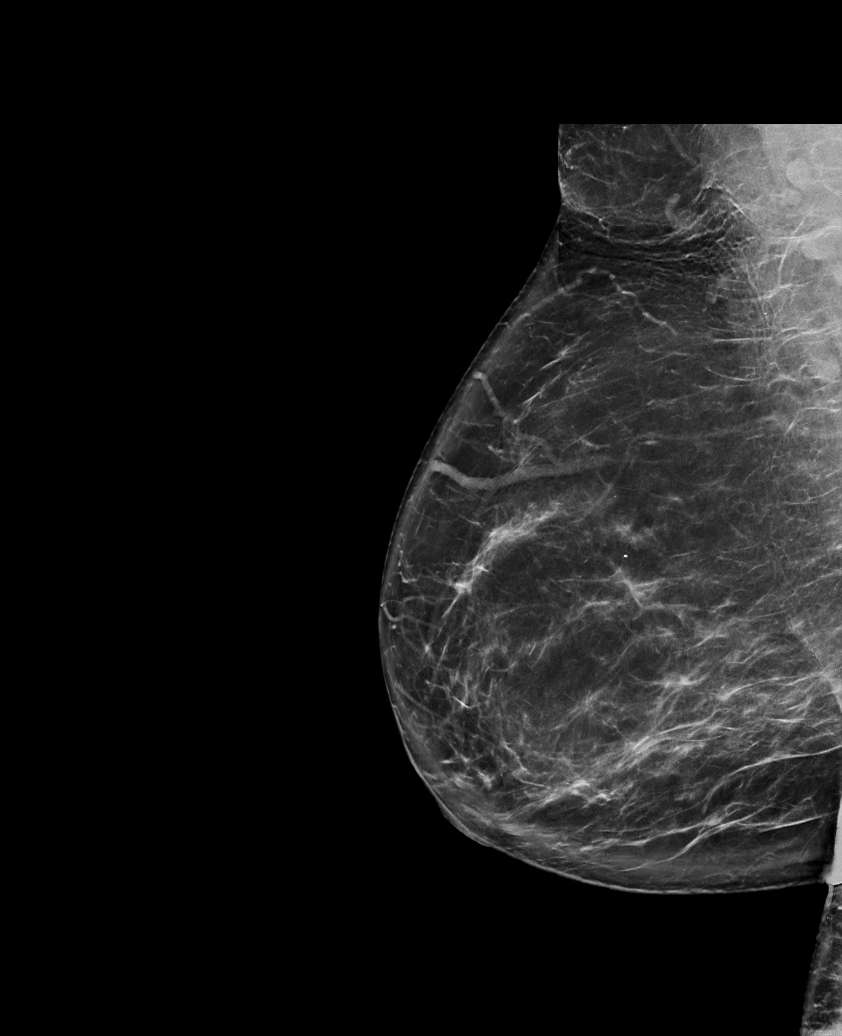

[L MLO synth-2D (2 of 2)]
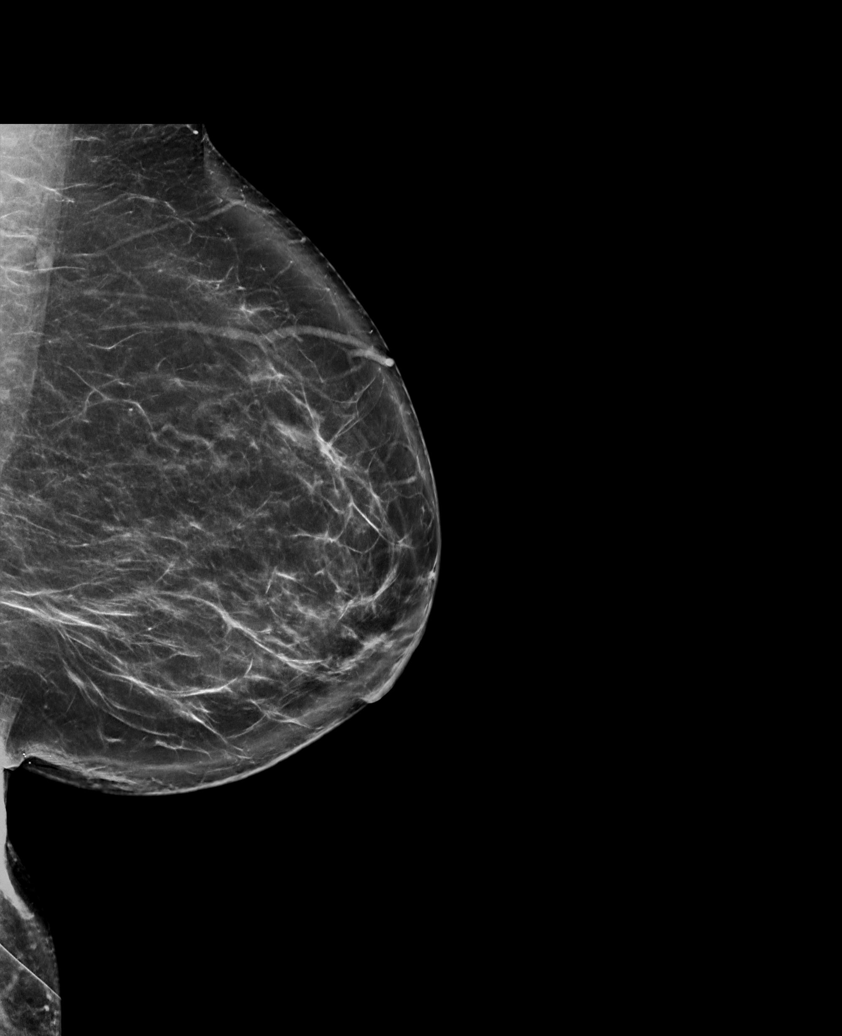

[R CC tomo · tomo slice 43/85.0]
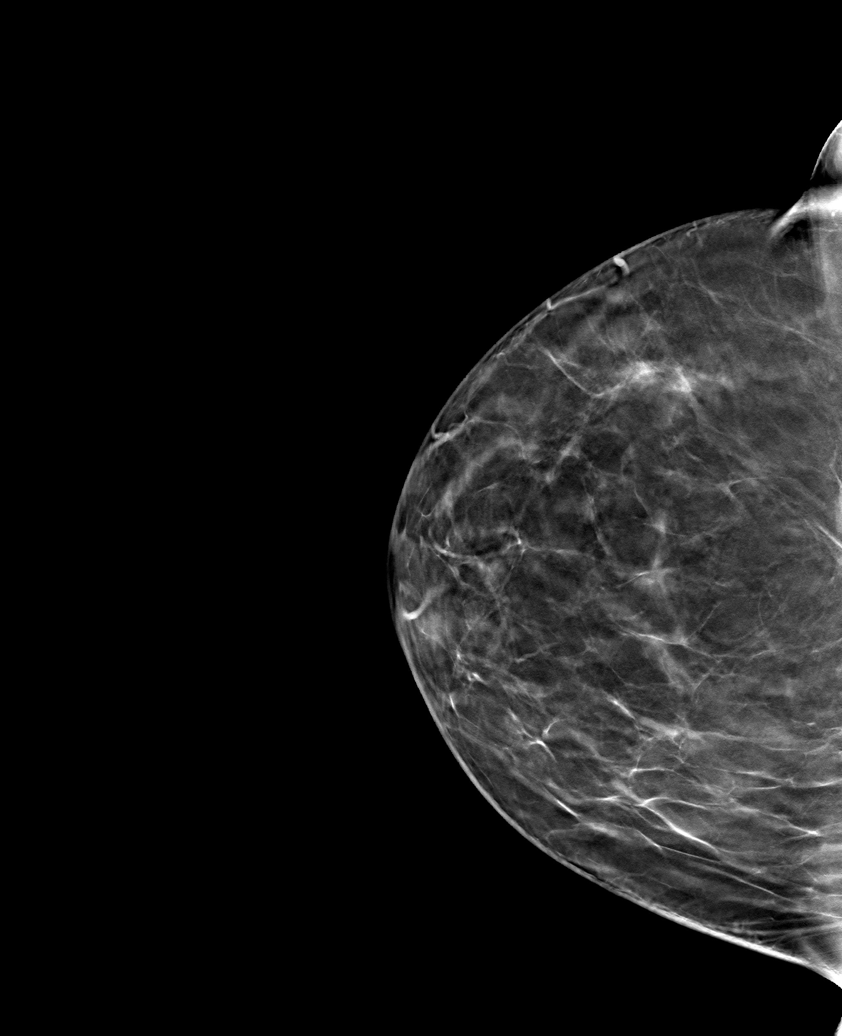

[6 of 30 positions shown; findings below may reference images not displayed]

ACR Breast Density Category b: There are scattered areas of
fibroglandular density.
FINDINGS: There are no findings suspicious for malignancy.
IMPRESSION: No mammographic evidence of malignancy. A result letter of this
screening mammogram will be mailed directly to the patient.

RECOMMENDATION:
Screening mammogram in one year. (Code:51-O-LD2)

BI-RADS CATEGORY  1: Negative.

## 2023-12-06 ENCOUNTER — Ambulatory Visit (INDEPENDENT_AMBULATORY_CARE_PROVIDER_SITE_OTHER): Payer: Federal, State, Local not specified - PPO | Admitting: Podiatry

## 2023-12-06 ENCOUNTER — Other Ambulatory Visit: Payer: Self-pay | Admitting: Obstetrics and Gynecology

## 2023-12-06 DIAGNOSIS — Z91198 Patient's noncompliance with other medical treatment and regimen for other reason: Secondary | ICD-10-CM

## 2023-12-06 DIAGNOSIS — Z1231 Encounter for screening mammogram for malignant neoplasm of breast: Secondary | ICD-10-CM

## 2023-12-06 NOTE — Progress Notes (Signed)
1. Failure to attend appointment with reason given    Appt rescheduled by patient.

## 2023-12-30 ENCOUNTER — Ambulatory Visit: Payer: Federal, State, Local not specified - PPO

## 2024-01-17 ENCOUNTER — Ambulatory Visit

## 2024-01-18 ENCOUNTER — Ambulatory Visit
Admission: RE | Admit: 2024-01-18 | Discharge: 2024-01-18 | Disposition: A | Discharge status: Home / Self Care | Source: Ambulatory Visit | Attending: Obstetrics and Gynecology | Admitting: Obstetrics and Gynecology

## 2024-01-18 DIAGNOSIS — Z1231 Encounter for screening mammogram for malignant neoplasm of breast: Secondary | ICD-10-CM

## 2024-01-24 ENCOUNTER — Encounter: Payer: Self-pay | Admitting: Podiatry

## 2024-01-24 ENCOUNTER — Ambulatory Visit (INDEPENDENT_AMBULATORY_CARE_PROVIDER_SITE_OTHER): Payer: Federal, State, Local not specified - PPO | Admitting: Podiatry

## 2024-01-24 VITALS — Ht 63.5 in | Wt 244.0 lb

## 2024-01-24 DIAGNOSIS — M79609 Pain in unspecified limb: Secondary | ICD-10-CM | POA: Diagnosis not present

## 2024-01-24 DIAGNOSIS — B351 Tinea unguium: Secondary | ICD-10-CM | POA: Diagnosis not present

## 2024-01-29 NOTE — Progress Notes (Signed)
  Subjective:  Patient ID: Taylor Reynolds, female    DOB: 07/24/1961,  MRN: [id number]  Taylor Reynolds presents to clinic today for painful, elongated thickened toenails x 10 which are symptomatic when wearing enclosed shoe gear. This interferes with his/her daily activities.  Chief Complaint  Patient presents with   [facility]    She is here for nail trim, PCP is sara spencer and seen once a year.    New problem(s): None.   PCP is Jearlean Mince, PA-C.  Allergies  Allergen Reactions   Bee Venom Anaphylaxis   Peanut Allergen Powder-Dnfp Anaphylaxis   Peanut-Containing Drug Products Anaphylaxis   Peanuts [Peanut Oil] Anaphylaxis    Review of Systems: Negative except as noted in the HPI.  Objective: No changes noted in today's physical examination. There were no vitals filed for this visit. Taylor Reynolds is a pleasant 63 y.o. female in NAD. AAO x 3.  Vascular Examination: Capillary refill time <3 seconds b/l LE. Palpable pedal pulses b/l LE. Digital hair present b/l. Skin temperature gradient WNL b/l. No varicosities b/l. Trace edema noted BLE. No cyanosis or clubbing noted b/l LE.Aaron Aas  Dermatological Examination: Pedal skin with normal turgor, texture and tone b/l. No open wounds. No interdigital macerations b/l. Toenails 1-5 b/l thickened, discolored, dystrophic with subungual debris. There is pain on palpation to dorsal aspect of nailplates. No corns, calluses nor porokeratotic lesions noted..  Neurological Examination: Protective sensation intact with 10 gram monofilament b/l LE. Vibratory sensation intact b/l LE.   Musculoskeletal Examination: Muscle strength 5/5 to all lower extremity muscle groups bilaterally. Pes planus deformity noted bilateral LE. Patient ambulates independent of any assistive aids.  Assessment/Plan: 1. Pain due to onychomycosis of nail     Consent given for treatment. Patient examined. All patient's and/or POA's  questions/concerns addressed on today's visit. Mycotic toenails 1-5 debrided in length and girth without incident. Continue soft, supportive shoe gear daily. Report any pedal injuries to medical professional. Call office if there are any quesitons/concerns. -Patient/POA to call should there be question/concern in the interim.   Return in about 3 months (around 04/24/2024).  Luella Sager, DPM      [place] LOCATION: [address] [place] [address]                   Office [phone]   [facility] LOCATION: [address] [place], [place] [address] Office [phone]

## 2024-05-02 ENCOUNTER — Ambulatory Visit (INDEPENDENT_AMBULATORY_CARE_PROVIDER_SITE_OTHER): Admitting: Podiatry

## 2024-05-02 ENCOUNTER — Encounter: Payer: Self-pay | Admitting: Podiatry

## 2024-05-02 DIAGNOSIS — M79609 Pain in unspecified limb: Secondary | ICD-10-CM | POA: Diagnosis not present

## 2024-05-02 DIAGNOSIS — B351 Tinea unguium: Secondary | ICD-10-CM | POA: Diagnosis not present

## 2024-05-04 ENCOUNTER — Ambulatory Visit: Payer: Self-pay | Admitting: Cardiology

## 2024-05-08 ENCOUNTER — Encounter: Payer: Self-pay | Admitting: Podiatry

## 2024-05-08 NOTE — Progress Notes (Signed)
  Subjective:  Patient ID: Taylor Reynolds, female    DOB: Aug 07, 1961,  MRN: [id number]  63 y.o. female presents painful thick toenails that are difficult to trim. Pain interferes with ambulation. Aggravating factors include wearing enclosed shoe gear. Pain is relieved with periodic professional debridement. Chief Complaint  Patient presents with   [facility]    Rm15 Not diabetic/Dr. Jacques last visit march 2025   New problem(s): None   PCP is Jacques Camie Pepper, PA-C.  Allergies  Allergen Reactions   Bee Venom Anaphylaxis   Peanut Allergen Powder-Dnfp Anaphylaxis   Peanut-Containing Drug Products Anaphylaxis   Peanuts [Peanut Oil] Anaphylaxis    Review of Systems: Negative except as noted in the HPI.   Objective:  Taylor Reynolds is a pleasant 63 y.o. female in NAD. AAO x 3.  Vascular Examination: Vascular status intact b/l with palpable pedal pulses. CFT immediate b/l. Pedal hair present. Lymphedema noted L>R LE. No pain with calf compression b/l. Skin temperature gradient WNL b/l. No varicosities noted. No cyanosis or clubbing noted.  Neurological Examination: Sensation grossly intact b/l with 10 gram monofilament. Vibratory sensation intact b/l.  Dermatological Examination: Pedal skin with normal turgor, texture and tone b/l. No open wounds nor interdigital macerations noted. Toenails 1-5 b/l thick, discolored, elongated with subungual debris and pain on dorsal palpation. No hyperkeratotic lesions noted b/l.   Musculoskeletal Examination: Muscle strength 5/5 to b/l LE.  No pain, crepitus noted b/l. Pes planus b/l. Patient ambulates independently without assistive aids.   Radiographs: None Last A1c:       No data to display           Assessment:   1. Pain due to onychomycosis of nail    Plan:  Patient was evaluated and treated. All patient's and/or POA's questions/concerns addressed on today's visit. Will check for lymphedema clinic in [place].  Toenails 1-5 debrided in length and girth without incident. Continue soft, supportive shoe gear daily. Report any pedal injuries to medical professional. Call office if there are any questions/concerns.  Return in about 3 months (around 08/02/2024).  Delon LITTIE Merlin, DPM      [place] LOCATION: [address], [place] [address]                   Office [phone]   [facility] LOCATION: [address] [place], [place] [address] Office [phone]

## 2024-05-09 ENCOUNTER — Encounter: Payer: Self-pay | Admitting: Podiatry

## 2024-08-19 ENCOUNTER — Ambulatory Visit (HOSPITAL_COMMUNITY)
Admission: EM | Admit: 2024-08-19 | Discharge: 2024-08-19 | Disposition: A | Discharge status: Home / Self Care | Attending: Emergency Medicine | Admitting: Emergency Medicine

## 2024-08-19 ENCOUNTER — Other Ambulatory Visit: Payer: Self-pay

## 2024-08-19 ENCOUNTER — Encounter (HOSPITAL_COMMUNITY): Payer: Self-pay | Admitting: Emergency Medicine

## 2024-08-19 DIAGNOSIS — S161XXA Strain of muscle, fascia and tendon at neck level, initial encounter: Secondary | ICD-10-CM | POA: Diagnosis not present

## 2024-08-19 MED ORDER — BACLOFEN 5 MG PO TABS: 5.0000 mg | ORAL_TABLET | Freq: Every evening | ORAL | 0 refills | Status: AC | PRN

## 2024-08-19 NOTE — ED Notes (Signed)
 Reviewed work note

## 2024-08-19 NOTE — Discharge Instructions (Addendum)
 Continue supportive care for muscle strain Ibuprofen  -- up to 800 mg every 6 hours as needed You can also use tylenol   Warm compress or heating pad Baclofen -- muscle relaxer, 1 tablet before bedtime as needed. Please be cautious as this may make you drowsy  Allow 3-4 more days for continued improvement and resolution

## 2024-08-19 NOTE — ED Notes (Signed)
 Discussed precautions with use of muscle relaxer and dangers of driving or making important decisions while taking this medication

## 2024-08-19 NOTE — ED Triage Notes (Signed)
 Right side of neck pain since Tuesday-crick in neck.  Pain is making head hurt.  Has taken tiger balm and ibuprofen .   Patient sleeps in a chair quite often.  Denies any falls

## 2024-08-19 NOTE — ED Provider Notes (Signed)
 [facility]    CSN: [id number] Arrival date & time: 08/19/24  1351     History   Chief Complaint Chief Complaint  Patient presents with   Neck Pain    HPI Taylor Reynolds is a 63 y.o. female.  Here with reported crick in her neck for about 3-4 days Worse with certain movement like looking right. Has been improving over the days. Rating 3/10 now Has used tiger balm and ibuprofen    Denies direct injury, trauma, fall Thinks it started after she fell asleep sitting up in chair  Minor headache No dizziness or vision changes   Pain does not radiate No pain in jaw, shoulder, arm. No numbness/tingling   Past Medical History:  Diagnosis Date   Abnormal Pap smear 06/1989   Anal itching 05/2009   ASCUS (atypical squamous cells of undetermined significance) on Pap smear 2006   Asthma    Axillary mass, right 05/2008   Chest pressure    H/O pelvic mass 2003   History of irregular menstrual bleeding 02/2011   History of measles, mumps, or rubella    Leg edema 02/01/2019   Ovarian cyst 01/2002   Pelvic pain 12/2005   Reflux    Strain of shoulder, left 04/2006   Urge incontinence 2001   Varicella     Patient Active Problem List   Diagnosis Date Noted   Screening for malignant neoplasm of colon 12/15/2022   Adrenal nodule 08/31/2022   Renal cyst 08/31/2022   Morbid (severe) obesity due to excess calories (HCC) 04/05/2022   Primary hypertension 03/17/2021   Elevated blood-pressure reading without diagnosis of hypertension 02/17/2021   Colon cancer screening 05/26/2020   History of colonic polyps 05/26/2020   Polyp of colon 09/07/2019   Leg edema 02/01/2019   Acid reflux 03/08/2013   Neck pain 04/05/2012   Vitamin D  deficiency disease 01/07/2012   Obesity (BMI 35.0-39.9 without comorbidity) 01/07/2012   Chest pain 02/12/2011   Asthma 02/12/2011   Mass of axilla 10/02/2008   Atypical squamous cells of undetermined significance (ASCUS) on Papanicolaou  smear of cervix 10/18/2004    Past Surgical History:  Procedure Laterality Date   ACNE CYST REMOVAL     Under eye   CESAREAN SECTION      OB History     Gravida  4   Para  3   Term      Preterm      AB      Living  3      SAB      IAB      Ectopic      Multiple      Live Births               Home Medications    Prior to Admission medications   Medication Sig Start Date End Date Taking? Authorizing Provider  Baclofen 5 MG TABS Take 1 tablet (5 mg total) by mouth at bedtime as needed. 08/19/24  Yes Milika Ventress, Asberry, PA-C  albuterol  (PROVENTIL  HFA;VENTOLIN  HFA) 108 (90 Base) MCG/ACT inhaler Inhale 1-2 puffs into the lungs every 6 (six) hours as needed for wheezing or shortness of breath. 01/07/19   Bast, Traci A, FNP  ergocalciferol  (VITAMIN D2) 1.25 MG (50000 UT) capsule Take by mouth. 02/23/21   [provider]    Family History Family History  Problem Relation Age of Onset   Diabetes Father    Emphysema Sister    Stroke Maternal Grandmother  Heart disease Maternal Grandmother    Heart disease Maternal Grandfather    Asthma Maternal Grandfather    Diabetes Other    Breast cancer Neg Hx     Social History Social History   Tobacco Use   Smoking status: Never   Smokeless tobacco: Never  Vaping Use   Vaping status: Never Used  Substance Use Topics   Alcohol use: No   Drug use: No     Allergies   Bee venom, Peanut allergen powder-dnfp, Peanut-containing drug products, and Peanuts [peanut oil]   Review of Systems Review of Systems  As per HPI  Physical Exam Triage Vital Signs ED Triage Vitals  Encounter Vitals Group     BP 08/19/24 1530 (!) 143/74     Girls Systolic BP Percentile --      Girls Diastolic BP Percentile --      Boys Systolic BP Percentile --      Boys Diastolic BP Percentile --      Pulse Rate 08/19/24 1530 67     Resp 08/19/24 1530 18     Temp 08/19/24 1530 97.7 F (36.5 C)     Temp Source 08/19/24 1530  Oral     SpO2 08/19/24 1530 98 %     Weight --      Height --      Head Circumference --      Peak Flow --      Pain Score 08/19/24 1528 3     Pain Loc --      Pain Education --      Exclude from Growth Chart --    No data found.  Updated Vital Signs BP (!) 143/74 (BP Location: Right Arm)   Pulse 67   Temp 97.7 F (36.5 C) (Oral)   Resp 18   LMP 10/06/2016 (Exact Date)   SpO2 98%    Physical Exam Vitals and nursing note reviewed.  Constitutional:      General: She is not in acute distress. HENT:     Mouth/Throat:     Pharynx: Oropharynx is clear.  Eyes:     Conjunctiva/sclera: Conjunctivae normal.     Pupils: Pupils are equal, round, and reactive to light.  Neck:      Comments: Good ROM of neck. Pain elicited with lateral rotation to right. No bony tenderness of spine. Muscular tenderness right trapezius  Cardiovascular:     Rate and Rhythm: Normal rate and regular rhythm.     Pulses: Normal pulses.     Heart sounds: Normal heart sounds.  Pulmonary:     Effort: Pulmonary effort is normal.     Breath sounds: Normal breath sounds.  Musculoskeletal:     Cervical back: Normal range of motion. Tenderness present. No rigidity. Muscular tenderness present. No spinous process tenderness. Normal range of motion.  Skin:    General: Skin is warm and dry.     Capillary Refill: Capillary refill takes less than 2 seconds.  Neurological:     Mental Status: She is alert and oriented to person, place, and time.     Comments: Strength and sensation equal, intact     UC Treatments / Results  Labs (all labs ordered are listed, but only abnormal results are displayed) Labs Reviewed - No data to display  EKG  Radiology No results found.  Procedures Procedures  Medications Ordered in UC Medications - No data to display  Initial Impression / Assessment and Plan / UC Course  I have  reviewed the triage vital signs and the nursing notes.  Pertinent labs & imaging results  that were available during my care of the patient were reviewed by me and considered in my medical decision making (see chart  for details).  Stable vitals, well appearing, neurologically intact  Reassuring already improving. Continue supportive care Try 5 mg baclofen at bedtime, drowsy precautions Advised prognosis of muscle injury Return and ED precaution   Final Clinical Impressions(s) / UC Diagnoses   Final diagnoses:  Strain of neck muscle, initial encounter     Discharge Instructions      Continue supportive care for muscle strain Ibuprofen  -- up to 800 mg every 6 hours as needed You can also use tylenol   Warm compress or heating pad Baclofen -- muscle relaxer, 1 tablet before bedtime as needed. Please be cautious as this may make you drowsy  Allow 3-4 more days for continued improvement and resolution       ED Prescriptions     Medication Sig Dispense Auth. Provider   Baclofen 5 MG TABS Take 1 tablet (5 mg total) by mouth at bedtime as needed. 10 tablet Ona Rathert, Asberry, PA-C      PDMP not reviewed this encounter.   Jeryl Asberry, PA-C 08/19/24 1630

## 2024-08-21 ENCOUNTER — Ambulatory Visit: Admitting: Podiatry

## 2024-08-21 ENCOUNTER — Encounter: Payer: Self-pay | Admitting: Podiatry

## 2024-08-21 DIAGNOSIS — B351 Tinea unguium: Secondary | ICD-10-CM

## 2024-08-21 DIAGNOSIS — M79609 Pain in unspecified limb: Secondary | ICD-10-CM | POA: Diagnosis not present

## 2024-08-21 DIAGNOSIS — Z91198 Patient's noncompliance with other medical treatment and regimen for other reason: Secondary | ICD-10-CM

## 2024-08-26 ENCOUNTER — Encounter: Payer: Self-pay | Admitting: Podiatry

## 2024-08-26 NOTE — Progress Notes (Signed)
 1. Failure to attend appointment with reason given    Appointment rescheduled to a later time slot.

## 2024-08-26 NOTE — Progress Notes (Signed)
  Subjective:  Patient ID: Taylor Reynolds, female    DOB: 04-09-1961,  MRN: [id number]  Taylor Reynolds presents to clinic today for: preventative diabetic foot care for painful elongated mycotic toenails 1-5 bilaterally which are tender when wearing enclosed shoe gear. Pain is relieved with periodic professional debridement.  Chief Complaint  Patient presents with   RFC    RFC not diabetic  PCP Camie Mirza, MD 12/28/23    PCP is Mirza Camie Pepper, PA-C.  Allergies  Allergen Reactions   Bee Venom Anaphylaxis   Peanut Allergen Powder-Dnfp Anaphylaxis   Peanut-Containing Drug Products Anaphylaxis   Peanuts [Peanut Oil] Anaphylaxis    Review of Systems: Negative except as noted in the HPI.  Objective: No changes noted in today's physical examination. There were no vitals filed for this visit.  Taylor Reynolds is a pleasant 63 y.o. female in NAD. AAO x 3.  Vascular Examination: Capillary refill time <3 seconds b/l LE. Palpable pedal pulses b/l LE. Digital hair present b/l. No pedal edema b/l. Skin temperature gradient WNL b/l. No varicosities b/l. No cyanosis or clubbing. No ischemia or gangrene. .  Dermatological Examination: Pedal skin with normal turgor, texture and tone b/l. No open wounds. No interdigital macerations b/l. Toenails 1-5 b/l thickened, discolored, dystrophic with subungual debris. There is pain on palpation to dorsal aspect of nailplates. No hyperkeratotic nor porokeratotic lesions.   Neurological Examination: Protective sensation intact with 10 gram monofilament b/l LE.   Musculoskeletal Examination: Muscle strength 5/5 to all lower extremity muscle groups bilaterally. Pes planus deformity noted bilateral LE.SABRA No pain, crepitus or joint limitation noted with ROM b/l LE.  Patient ambulates independently without assistive aids.  Assessment/Plan: 1. Pain due to onychomycosis of nail   Patient was evaluated and treated. All  patient's and/or POA's questions/concerns addressed on today's visit. Toenails 1-5 b/l debrided in length and girth without incident. Continue soft, supportive shoe gear daily. Report any pedal injuries to medical professional. Call office if there are any questions/concerns. -Patient/POA to call should there be question/concern in the interim.   Return in about 3 months (around 11/21/2024).  Taylor Reynolds, DPM      [facility] LOCATION: [address] [place] [address]                   Office [phone]   [facility] LOCATION: [address] [place], [place] [address] Office [phone]

## 2024-12-05 ENCOUNTER — Ambulatory Visit: Admitting: Podiatry
